# Patient Record
Sex: Male | Born: 1985 | Race: White | Hispanic: No | Marital: Married | Smoking: Never smoker
Health system: Southern US, Community
[De-identification: ages and names within clinical notes are randomized; demographics above are authoritative.]

## PROBLEM LIST (undated history)

## (undated) DIAGNOSIS — K589 Irritable bowel syndrome without diarrhea: Secondary | ICD-10-CM

## (undated) DIAGNOSIS — K219 Gastro-esophageal reflux disease without esophagitis: Secondary | ICD-10-CM

## (undated) DIAGNOSIS — R1013 Epigastric pain: Secondary | ICD-10-CM

## (undated) HISTORY — PX: OTHER SURGICAL HISTORY: SHX169

## (undated) HISTORY — PX: HAND SURGERY: SHX662

---
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
## 2004-05-30 ENCOUNTER — Ambulatory Visit (HOSPITAL_BASED_OUTPATIENT_CLINIC_OR_DEPARTMENT_OTHER): Admission: RE | Admit: 2004-05-30 | Discharge: 2004-05-30 | Payer: Self-pay | Admitting: Otolaryngology

## 2004-08-31 ENCOUNTER — Ambulatory Visit (HOSPITAL_BASED_OUTPATIENT_CLINIC_OR_DEPARTMENT_OTHER): Admission: RE | Admit: 2004-08-31 | Discharge: 2004-08-31 | Payer: Self-pay | Admitting: Orthopedic Surgery

## 2008-10-07 ENCOUNTER — Telehealth (INDEPENDENT_AMBULATORY_CARE_PROVIDER_SITE_OTHER): Payer: Self-pay | Admitting: *Deleted

## 2008-10-07 ENCOUNTER — Ambulatory Visit: Payer: Self-pay | Admitting: Family Medicine

## 2008-10-07 DIAGNOSIS — D409 Neoplasm of uncertain behavior of male genital organ, unspecified: Secondary | ICD-10-CM

## 2008-10-07 DIAGNOSIS — L723 Sebaceous cyst: Secondary | ICD-10-CM

## 2008-10-14 ENCOUNTER — Encounter (INDEPENDENT_AMBULATORY_CARE_PROVIDER_SITE_OTHER): Payer: Self-pay | Admitting: *Deleted

## 2008-10-14 LAB — CONVERTED CEMR LAB
Chlamydia, Swab/Urine, PCR: NEGATIVE
GC Probe Amp, Urine: NEGATIVE

## 2009-10-10 ENCOUNTER — Ambulatory Visit: Payer: Self-pay | Admitting: Family Medicine

## 2009-10-10 DIAGNOSIS — J309 Allergic rhinitis, unspecified: Secondary | ICD-10-CM | POA: Insufficient documentation

## 2009-10-10 DIAGNOSIS — R5381 Other malaise: Secondary | ICD-10-CM

## 2009-10-10 DIAGNOSIS — R5383 Other fatigue: Secondary | ICD-10-CM

## 2009-10-11 ENCOUNTER — Ambulatory Visit: Payer: Self-pay | Admitting: Family Medicine

## 2009-10-11 DIAGNOSIS — E538 Deficiency of other specified B group vitamins: Secondary | ICD-10-CM | POA: Insufficient documentation

## 2009-10-11 LAB — CONVERTED CEMR LAB
ALT: 36 units/L (ref 0–53)
AST: 26 units/L (ref 0–37)
Albumin: 4.7 g/dL (ref 3.5–5.2)
Alkaline Phosphatase: 73 units/L (ref 39–117)
BUN: 13 mg/dL (ref 6–23)
Basophils Absolute: 0 10*3/uL (ref 0.0–0.1)
Basophils Relative: 0.6 % (ref 0.0–3.0)
Bilirubin, Direct: 0.1 mg/dL (ref 0.0–0.3)
CO2: 31 meq/L (ref 19–32)
Calcium: 9.5 mg/dL (ref 8.4–10.5)
Chloride: 102 meq/L (ref 96–112)
Creatinine, Ser: 1 mg/dL (ref 0.4–1.5)
Eosinophils Absolute: 0.4 10*3/uL (ref 0.0–0.7)
Eosinophils Relative: 5.8 % — ABNORMAL HIGH (ref 0.0–5.0)
Folate: 8.3 ng/mL
GFR calc non Af Amer: 99.01 mL/min (ref >60)
Glucose, Bld: 97 mg/dL (ref 70–99)
HCT: 46 % (ref 39.0–52.0)
Hemoglobin: 16 g/dL (ref 13.0–17.0)
Lymphocytes Relative: 25.9 % (ref 12.0–46.0)
Lymphs Abs: 1.7 10*3/uL (ref 0.7–4.0)
MCHC: 34.7 g/dL (ref 30.0–36.0)
MCV: 87.3 fL (ref 78.0–100.0)
Monocytes Absolute: 0.5 10*3/uL (ref 0.1–1.0)
Monocytes Relative: 7.6 % (ref 3.0–12.0)
Neutro Abs: 3.8 10*3/uL (ref 1.4–7.7)
Neutrophils Relative %: 60.1 % (ref 43.0–77.0)
Platelets: 201 10*3/uL (ref 150.0–400.0)
Potassium: 4.4 meq/L (ref 3.5–5.1)
RBC: 5.27 M/uL (ref 4.22–5.81)
RDW: 12.4 % (ref 11.5–14.6)
Sodium: 140 meq/L (ref 135–145)
TSH: 0.66 microintl units/mL (ref 0.35–5.50)
Total Bilirubin: 0.7 mg/dL (ref 0.3–1.2)
Total Protein: 7.6 g/dL (ref 6.0–8.3)
Vitamin B-12: 282 pg/mL (ref 211–911)
WBC: 6.4 10*3/uL (ref 4.5–10.5)

## 2009-10-12 LAB — CONVERTED CEMR LAB
IgE (Immunoglobulin E), Serum: 206.7 intl units/mL — ABNORMAL HIGH (ref 0.0–180.0)
Sex Hormone Binding: 30 nmol/L (ref 13–71)
Testosterone Free: 79.5 pg/mL (ref 47.0–244.0)
Testosterone-% Free: 2.1 % (ref 1.6–2.9)
Testosterone: 373.49 ng/dL (ref 350–890)

## 2009-10-18 ENCOUNTER — Ambulatory Visit (HOSPITAL_BASED_OUTPATIENT_CLINIC_OR_DEPARTMENT_OTHER): Admission: RE | Admit: 2009-10-18 | Discharge: 2009-10-18 | Payer: Self-pay | Admitting: Family Medicine

## 2009-10-18 ENCOUNTER — Ambulatory Visit: Payer: Self-pay | Admitting: Family Medicine

## 2009-10-18 ENCOUNTER — Ambulatory Visit: Payer: Self-pay | Admitting: Diagnostic Radiology

## 2009-10-18 DIAGNOSIS — M546 Pain in thoracic spine: Secondary | ICD-10-CM | POA: Insufficient documentation

## 2009-10-18 DIAGNOSIS — M79609 Pain in unspecified limb: Secondary | ICD-10-CM

## 2009-10-18 DIAGNOSIS — L0591 Pilonidal cyst without abscess: Secondary | ICD-10-CM | POA: Insufficient documentation

## 2009-10-26 ENCOUNTER — Encounter: Payer: Self-pay | Admitting: Family Medicine

## 2010-03-10 ENCOUNTER — Ambulatory Visit: Admit: 2010-03-10 | Payer: Self-pay | Admitting: Internal Medicine

## 2010-03-21 NOTE — Assessment & Plan Note (Signed)
Summary: fatigue, bee sting//fd   Vital Signs:  Patient profile:   25 year old male Weight:      219 pounds BMI:     41.53 Temp:     98.1 degrees F BP sitting:   116 / 76  (left arm)  Vitals Entered By: Almeta Monas CMA Duncan Dull) (October 10, 2009 9:43 AM) CC: c/o feeling Fatigue and requests labwork, pt is fasting   History of Present Illness: Pt here c/o extreme fatigue ---for months.    Pt is under a lot of stress .    Pt exercise tolerance in heat is being affected.  No SOB, no cp,   no joint pain.  Pt has been stung by yellow jackets 2x this summer.  Symptoms seemed  to be exacerbated by these.   No other complaints.  Pt states he is sleeping well.  Current Medications (verified): 1)  B-100 Complex  Tabs (Vitamins-Lipotropics)  Allergies (verified): No Known Drug Allergies  Past History:  Past medical, surgical, family and social histories (including risk factors) reviewed for relevance to current acute and chronic problems.  Family History: Reviewed history and no changes required. Family History of Alcoholism/Addiction Family History of CAD Male 1st degree relative <60 Family History High cholesterol Family History Hypertension Family History Liver disease Family History of Prostate CA 1st degree relative <50 Family History of Thromboembolism clotting disorder  Social History: Reviewed history and no changes required. Occupation: Single Alcohol use-yes Occupation:  employed  Review of Systems      See HPI  Physical Exam  General:  Well-developed,well-nourished,in no acute distress; alert,appropriate and cooperative throughout examination Neck:  No deformities, masses, or tenderness noted. Lungs:  Normal respiratory effort, chest expands symmetrically. Lungs are clear to auscultation, no crackles or wheezes. Heart:  normal rate and no murmur.   Extremities:  No clubbing, cyanosis, edema, or deformity noted with normal full range of motion of all joints.     Skin:  Intact without suspicious lesions or rashes Cervical Nodes:  No lymphadenopathy noted Psych:  Oriented X3, normally interactive, good eye contact, not anxious appearing, and not depressed appearing.     Impression & Recommendations:  Problem # 1:  FATIGUE (ICD-780.79)  Orders: Venipuncture (56213) TLB-B12 + Folate Pnl (08657_84696-E95/MWU) TLB-BMP (Basic Metabolic Panel-BMET) (80048-METABOL) TLB-CBC Platelet - w/Differential (85025-CBCD) TLB-Hepatic/Liver Function Pnl (80076-HEPATIC) TLB-TSH (Thyroid Stimulating Hormone) (13244-WNU) T-Allergy Profile Region [place], [place], [place], [place], [place] [phone]) T- * Misc. Laboratory test [phone]) Specimen Handling (03474)  Complete Medication List: 1)  B-100 Complex Tabs (Vitamins-lipotropics)  Patient Instructions: 1)  con't b complex vitamins

## 2010-03-21 NOTE — Consult Note (Signed)
Summary: [facility]   Imported By: Lanelle Bal 11/21/2009 08:10:28  _____________________________________________________________________  External Attachment:    Type:   Image     Comment:   External Document
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
## 2010-03-21 NOTE — Assessment & Plan Note (Signed)
Summary: B-12//PH  Nurse Visit   Allergies: No Known Drug Allergies  Medication Administration  Injection # 1:    Medication: Vit B12 1000 mcg    Diagnosis: B12 DEFICIENCY (ICD-266.2)    Route: IM    Site: R deltoid    Exp Date: 04/20/2011    Lot #: [id number]    Mfr: [employer]ent  Orders Added: 1)  Vit B12 1000 mcg [J3420] 2)  Admin of Therapeutic Inj  intramuscular or subcutaneous [98119]
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
## 2012-04-28 ENCOUNTER — Telehealth: Payer: Self-pay | Admitting: Family Medicine

## 2012-04-28 NOTE — Telephone Encounter (Signed)
He will need to call call a nurse, we can not call out.     KP

## 2012-04-28 NOTE — Telephone Encounter (Signed)
I gave him call a nurse number

## 2012-04-28 NOTE — Telephone Encounter (Signed)
Benetta Spar Claris Che) patterson called stating her brother has terrible stomach pains and needs to be seen -----[phone]

## 2012-04-28 NOTE — Telephone Encounter (Signed)
Can brittany call?

## 2012-04-29 ENCOUNTER — Ambulatory Visit (INDEPENDENT_AMBULATORY_CARE_PROVIDER_SITE_OTHER): Payer: Self-pay | Admitting: Family Medicine

## 2012-04-29 ENCOUNTER — Encounter: Payer: Self-pay | Admitting: Family Medicine

## 2012-04-29 VITALS — BP 102/80 | HR 71 | Temp 98.5°F | Ht 72.5 in | Wt 219.8 lb

## 2012-04-29 DIAGNOSIS — K589 Irritable bowel syndrome without diarrhea: Secondary | ICD-10-CM

## 2012-04-29 DIAGNOSIS — R109 Unspecified abdominal pain: Secondary | ICD-10-CM

## 2012-04-29 MED ORDER — OMEPRAZOLE MAGNESIUM 20 MG PO TBEC: DELAYED_RELEASE_TABLET | ORAL | Status: DC

## 2012-04-29 MED ORDER — HYOSCYAMINE SULFATE 0.125 MG SL SUBL: 0.1250 mg | SUBLINGUAL_TABLET | SUBLINGUAL | Status: DC | PRN

## 2012-04-29 NOTE — Progress Notes (Signed)
  Subjective:     William Holt is a 27 y.o. male who presents for evaluation of abdominal pain. Onset was several years ago-- he has a long hx of gerd and IBS----he saw Dr Kinnie Scales in past---he thought he might have crohns but colonoscopy never done because pt was 27 yo.  . Symptoms have been gradually worsening. The pain is described as burning and cramping, . Pain is located in the epigastric region and suprapubic region without radiation.  Aggravating factors: alcohol, coffee and spicy foods.  Alleviating factors: H2 blockers and levsin. Associated symptoms: belching and flatus. The patient denies anorexia, arthralagias, chills, constipation, diarrhea, dysuria, fever, frequency, headache, hematochezia, hematuria, melena, myalgias, nausea, sweats and vomiting.  The patient's history has been marked as reviewed and updated as appropriate. allergies, current medications, past family history, past medical history, past social history, past surgical history and problem list  Review of Systems Pertinent items are noted in HPI.     Objective:    BP 102/80  Pulse 71  Temp(Src) 98.5 F (36.9 C) (Oral)  Ht 6' 0.5" (1.842 m)  Wt 219 lb 12.8 oz (99.701 kg)  BMI 29.38 kg/m2  SpO2 98% General appearance: alert, cooperative, appears stated age and no distress Abdomen: abnormal findings:  moderate tenderness in the epigastrium and in the lower abdomen    Assessment:    Abdominal pain, likely secondary to ibs and gerd .    Plan:    The diagnosis was discussed with the patient and evaluation and treatment plans outlined. See orders for lab and imaging studies. Adhere to simple, bland diet. Initiate empiric trial of acid suppression; see orders. Initiate trial of anticholinergic medications for IBS. Further follow-up plans will be based on outcome of lab/imaging studies; see orders. refer to GI

## 2012-04-29 NOTE — Patient Instructions (Signed)
Irritable Bowel Syndrome Irritable Bowel Syndrome (IBS) is caused by a disturbance of normal bowel function. Other terms used are spastic colon, mucous colitis, and irritable colon. It does not require surgery, nor does it lead to cancer. There is no cure for IBS. But with proper diet, stress reduction, and medication, you will find that your problems (symptoms) will gradually disappear or improve. IBS is a common digestive disorder. It usually appears in late adolescence or early adulthood. Women develop it twice as often as men. CAUSES  After food has been digested and absorbed in the small intestine, waste material is moved into the colon (large intestine). In the colon, water and salts are absorbed from the undigested products coming from the small intestine. The remaining residue, or fecal material, is held for elimination. Under normal circumstances, gentle, rhythmic contractions on the bowel walls push the fecal material along the colon towards the rectum. In IBS, however, these contractions are irregular and poorly coordinated. The fecal material is either retained too long, resulting in constipation, or expelled too soon, producing diarrhea. SYMPTOMS  The most common symptom of IBS is pain. It is typically in the lower left side of the belly (abdomen). But it may occur anywhere in the abdomen. It can be felt as heartburn, backache, or even as a dull pain in the arms or shoulders. The pain comes from excessive bowel-muscle spasms and from the buildup of gas and fecal material in the colon. This pain:  Can range from sharp belly (abdominal) cramps to a dull, continuous ache.  Usually worsens soon after eating.  Is typically relieved by having a bowel movement or passing gas. Abdominal pain is usually accompanied by constipation. But it may also produce diarrhea. The diarrhea typically occurs right after a meal or upon arising in the morning. The stools are typically soft and watery. They are often  flecked with secretions (mucus). Other symptoms of IBS include:  Bloating.  Loss of appetite.  Heartburn.  Feeling sick to your stomach (nausea).  Belching  Vomiting  Gas. IBS may also cause a number of symptoms that are unrelated to the digestive system:  Fatigue.  Headaches.  Anxiety  Shortness of breath  Difficulty in concentrating.  Dizziness. These symptoms tend to come and go. DIAGNOSIS  The symptoms of IBS closely mimic the symptoms of other, more serious digestive disorders. So your caregiver may wish to perform a variety of additional tests to exclude these disorders. He/she wants to be certain of learning what is wrong (diagnosis). The nature and purpose of each test will be explained to you. TREATMENT A number of medications are available to help correct bowel function and/or relieve bowel spasms and abdominal pain. Among the drugs available are:  Mild, non-irritating laxatives for severe constipation and to help restore normal bowel habits.  Specific anti-diarrheal medications to treat severe or prolonged diarrhea.  Anti-spasmodic agents to relieve intestinal cramps.  Your caregiver may also decide to treat you with a mild tranquilizer or sedative during unusually stressful periods in your life. The important thing to remember is that if any drug is prescribed for you, make sure that you take it exactly as directed. Make sure that your caregiver knows how well it worked for you. HOME CARE INSTRUCTIONS   Avoid foods that are high in fat or oils. Some examples JJO:ACZYS cream, butter, frankfurters, sausage, and other fatty meats.  Avoid foods that have a laxative effect, such as fruit, fruit juice, and dairy products.  Cut out  carbonated drinks, chewing gum, and "gassy" foods, such as beans and cabbage. This may help relieve bloating and belching.  Bran taken with plenty of liquids may help relieve constipation.  Keep track of what foods seem to trigger  your symptoms.  Avoid emotionally charged situations or circumstances that produce anxiety.  Start or continue exercising.  Get plenty of rest and sleep. MAKE SURE YOU:   Understand these instructions.  Will watch your condition.  Will get help right away if you are not doing well or get worse. Document Released: 02/05/2005 Document Revised: 04/30/2011 Document Reviewed: 09/26/2007 [facility] Patient Information 2013 [place], [place].  Diet for Gastroesophageal Reflux Disease, Adult Reflux (acid reflux) is when acid from your stomach flows up into the esophagus. When acid comes in contact with the esophagus, the acid causes irritation and soreness (inflammation) in the esophagus. When reflux happens often or so severely that it causes damage to the esophagus, it is called gastroesophageal reflux disease (GERD). Nutrition therapy can help ease the discomfort of GERD. FOODS OR DRINKS TO AVOID OR LIMIT  Smoking or chewing tobacco. Nicotine is one of the most potent stimulants to acid production in the gastrointestinal tract.  Caffeinated and decaffeinated coffee and black tea.  Regular or low-calorie carbonated beverages or energy drinks (caffeine-free carbonated beverages are allowed).   Strong spices, such as black pepper, white pepper, red pepper, cayenne, curry powder, and chili powder.  Peppermint or spearmint.  Chocolate.  High-fat foods, including meats and fried foods. Extra added fats including oils, butter, salad dressings, and nuts. Limit these to less than 8 tsp per day.  Fruits and vegetables if they are not tolerated, such as citrus fruits or tomatoes.  Alcohol.  Any food that seems to aggravate your condition. If you have questions regarding your diet, call your caregiver or a registered dietitian. OTHER THINGS THAT MAY HELP GERD INCLUDE:   Eating your meals slowly, in a relaxed setting.  Eating 5 to 6 small meals per day instead of 3 large meals.  Eliminating  food for a period of time if it causes distress.  Not lying down until 3 hours after eating a meal.  Keeping the head of your bed raised 6 to 9 inches (15 to 23 cm) by using a foam wedge or blocks under the legs of the bed. Lying flat may make symptoms worse.  Being physically active. Weight loss may be helpful in reducing reflux in overweight or obese adults.  Wear loose fitting clothing EXAMPLE MEAL PLAN This meal plan is approximately 2,000 calories based on https://www.bernard.org/ meal planning guidelines. Breakfast   cup cooked oatmeal.  1 cup strawberries.  1 cup low-fat milk.  1 oz almonds. Snack  1 cup cucumber slices.  6 oz yogurt (made from low-fat or fat-free milk). Lunch  2 slice whole-wheat bread.  2 oz sliced [place].  2 tsp mayonnaise.  1 cup blueberries.  1 cup snap peas. Snack  6 whole-wheat crackers.  1 oz string cheese. Dinner   cup brown rice.  1 cup mixed veggies.  1 tsp olive oil.  3 oz grilled fish. Document Released: 02/05/2005 Document Revised: 04/30/2011 Document Reviewed: 12/22/2010 [facility] Patient Information 2013 [place], [place].

## 2012-04-30 LAB — BASIC METABOLIC PANEL
BUN: 15 mg/dL (ref 6–23)
CO2: 30 mEq/L (ref 19–32)
Calcium: 9.2 mg/dL (ref 8.4–10.5)
Chloride: 101 mEq/L (ref 96–112)
Creatinine, Ser: 1.1 mg/dL (ref 0.4–1.5)
GFR: 89.65 mL/min (ref >60.00)
Glucose, Bld: 104 mg/dL — ABNORMAL HIGH (ref 70–99)
Potassium: 4.2 mEq/L (ref 3.5–5.1)
Sodium: 138 mEq/L (ref 135–145)

## 2012-04-30 LAB — HEPATIC FUNCTION PANEL
ALT: 21 U/L (ref 0–53)
AST: 22 U/L (ref 0–37)
Albumin: 4.6 g/dL (ref 3.5–5.2)
Alkaline Phosphatase: 65 U/L (ref 39–117)
Bilirubin, Direct: 0 mg/dL (ref 0.0–0.3)
Total Bilirubin: 0.7 mg/dL (ref 0.3–1.2)
Total Protein: 7.7 g/dL (ref 6.0–8.3)

## 2012-04-30 LAB — CBC WITH DIFFERENTIAL/PLATELET
Basophils Absolute: 0.1 10*3/uL (ref 0.0–0.1)
Basophils Relative: 0.8 % (ref 0.0–3.0)
Eosinophils Absolute: 0.3 10*3/uL (ref 0.0–0.7)
Eosinophils Relative: 3.8 % (ref 0.0–5.0)
HCT: 44 % (ref 39.0–52.0)
Hemoglobin: 15 g/dL (ref 13.0–17.0)
Lymphocytes Relative: 25.9 % (ref 12.0–46.0)
Lymphs Abs: 2.1 10*3/uL (ref 0.7–4.0)
MCHC: 34.1 g/dL (ref 30.0–36.0)
MCV: 85.1 fl (ref 78.0–100.0)
Monocytes Absolute: 0.5 10*3/uL (ref 0.1–1.0)
Monocytes Relative: 6.3 % (ref 3.0–12.0)
Neutro Abs: 5.1 10*3/uL (ref 1.4–7.7)
Neutrophils Relative %: 63.2 % (ref 43.0–77.0)
Platelets: 209 10*3/uL (ref 150.0–400.0)
RBC: 5.17 Mil/uL (ref 4.22–5.81)
RDW: 12.3 % (ref 11.5–14.6)
WBC: 8.1 10*3/uL (ref 4.5–10.5)

## 2012-04-30 LAB — H. PYLORI ANTIBODY, IGG: H Pylori IgG: NEGATIVE

## 2012-04-30 MED ORDER — GI COCKTAIL ~~LOC~~
30.0000 mL | Freq: Once | ORAL | Status: AC
  Administered 2012-04-30: 30 mL via ORAL

## 2012-04-30 NOTE — Addendum Note (Signed)
Addended by: Arnette Norris on: 04/30/2012 08:28 AM   Modules accepted: Orders

## 2012-05-07 ENCOUNTER — Encounter: Payer: Self-pay | Admitting: Family Medicine

## 2012-06-02 ENCOUNTER — Telehealth: Payer: Self-pay | Admitting: Family Medicine

## 2012-06-02 ENCOUNTER — Encounter: Payer: Self-pay | Admitting: Family Medicine

## 2012-06-02 NOTE — Telephone Encounter (Signed)
In reference to GI referral entered on 04/29/12, patient has never scheduled.  [facility] attempted to reach patient and left message for a return call, as have I.  I also have mailed patient a letter, patient is not responding.

## 2012-06-02 NOTE — Telephone Encounter (Signed)
noted 

## 2012-06-02 NOTE — Telephone Encounter (Signed)
error 

## 2012-12-25 ENCOUNTER — Other Ambulatory Visit: Payer: Self-pay

## 2013-02-16 ENCOUNTER — Other Ambulatory Visit (INDEPENDENT_AMBULATORY_CARE_PROVIDER_SITE_OTHER): Payer: Self-pay

## 2013-02-16 ENCOUNTER — Encounter: Payer: Self-pay | Admitting: Family Medicine

## 2013-02-16 ENCOUNTER — Ambulatory Visit (INDEPENDENT_AMBULATORY_CARE_PROVIDER_SITE_OTHER): Payer: Self-pay | Admitting: Family Medicine

## 2013-02-16 VITALS — BP 114/80 | HR 79 | Temp 97.6°F | Wt 212.0 lb

## 2013-02-16 DIAGNOSIS — N419 Inflammatory disease of prostate, unspecified: Secondary | ICD-10-CM

## 2013-02-16 DIAGNOSIS — R3915 Urgency of urination: Secondary | ICD-10-CM

## 2013-02-16 LAB — URINALYSIS
Bilirubin Urine: NEGATIVE
Hgb urine dipstick: NEGATIVE
Ketones, ur: NEGATIVE
Leukocytes, UA: NEGATIVE
Nitrite: NEGATIVE
Specific Gravity, Urine: 1.02 (ref 1.000–1.030)
Total Protein, Urine: NEGATIVE
Urine Glucose: NEGATIVE
Urobilinogen, UA: 0.2 (ref 0.0–1.0)
pH: 6 (ref 5.0–8.0)

## 2013-02-16 MED ORDER — CIPROFLOXACIN HCL 500 MG PO TABS: 500.0000 mg | ORAL_TABLET | Freq: Two times a day (BID) | ORAL | Status: DC

## 2013-02-16 NOTE — Patient Instructions (Signed)
Very nice to meet you I will call you with the lab results Start the cipro again and take 2 times daily  If not better keep your appointment with urology.  Happy New Year! Prostatitis The prostate gland is about the size and shape of a walnut. It is located just below your bladder. It produces one of the components of semen, which is made up of sperm and the fluids that help nourish and transport it out from the testicles. Prostatitis is redness, soreness, and swelling (inflammation) of the prostate gland.  There are 3 types of prostatitis:  Acute bacterial prostatitis This is the least common type of prostatitis. It starts quickly and usually leads to a bladder infection. It can occur at any age.  Chronic bacterial prostatitis This is a persistent bacterial infection in the prostate. It usually develops from repeated acute bacterial prostatitis or acute bacterial prostatitis that was not properly treated. It can occur in men of any age but is most common in middle-aged men whose prostate has begun to enlarge.   Chronic prostatitis chronic pelvic pain syndrome This is the most common type of prostatitis. It is inflammation of the prostate gland that is not caused by a bacterial infection. The cause is unknown. CAUSES The cause of acute and chronic bacterial prostatitis is a bacterial infection. The exact cause of chronic prostatitis and chronic pelvic pain syndrome and asymptomatic inflammatory prostatitis is unknown.  SYMPTOMS  Symptoms can vary depending upon the type of prostatitis that exists. There can also be overlap in symptoms. Possible symptoms for each type of prostatitis are listed below. Acute bacterial prostatitis  Painful urination.  Fever or chills.  Muscle or joint pains.  Low back pain.  Low abdominal pain.  Inability to empty bladder completely.  Sudden urge to urinate.  Frequent urination.  Difficulty starting urine stream.  Weak urine stream.  Discharge  from the urethra.  Dribbling after urination.  Rectal pain.  Pain in the testicles, penis, or tip of the penis.  Pain in the space between the anus and scrotum (perineum).  Problems with sexual function.  Painful ejaculation.  Bloody semen. Chronic bacterial prostatitis  The symptoms are similar to those of acute bacterial prostatitis, but they usually are much less severe. Fever, chills, and muscle and joint pain are not associated with chronic bacterial prostatitis. Chronic prostatitis chronic pelvic pain syndrome  Symptoms typically include a dull ache in the scrotum and the perineum. DIAGNOSIS  In order to diagnose prostatitis, your caregiver will ask about your symptoms. If acute or chronic bacterial prostatitis is suspected, a urine sample will be taken and tested (urinalysis). This is to see if there is bacteria in your urine. If the urinalysis result is negative for bacteria, your caregiver may use a finger to feel your prostate (digital rectal exam). This exam helps your caregiver determine if your prostate is swollen and tender. TREATMENT  Treatment for prostatitis depends on the cause. If a bacterial infection is the cause, it can be treated with antibiotic medicine. In cases of chronic bacterial prostatitis, the use of antibiotics for up to 1 month may be necessary. Your caregiver may instruct you to take sitz baths to help relieve pain. A sitz bath is a bath of hot water in which your hips and buttocks are under water. HOME CARE INSTRUCTIONS   Take all medicines as directed by your caregiver.  Take sitz baths as directed by your caregiver. SEEK MEDICAL CARE IF:   Your symptoms get  worse, not better.  You have a fever. SEEK IMMEDIATE MEDICAL CARE IF:   You have chills.  You feel nauseous or vomit.  You feel lightheaded or faint.  You are unable to urinate.  You have blood or blood clots in your urine. Document Released: 02/03/2000 Document Revised: 04/30/2011  Document Reviewed: 08/25/2012 [facility] Patient Information 2014 [place], [place].

## 2013-02-16 NOTE — Progress Notes (Signed)
SUBJECTIVE: William Holt is a 27 y.o. male who complains of urinary frequency, urgency and dysuria x 14 days, without flank pain, fever, chills, or a or bleeding. Patient has never had a problem like this previously. Patient is sexually active for with one partner for greater than 8 years. Patient did have some Cipro and has tried it and was improving. Patient rated of the supra-infra-and the pain started to come back. More of the urgency seems to be the most concerning. No new medications otherwise.  Past medical history, social, surgical and family history all reviewed.    OBJECTIVE: Appears well, in no apparent distress.  Vital signs are normal. The abdomen is soft without tenderness, guarding, mass, rebound or organomegaly. No CVA tenderness or inguinal adenopathy noted. Urine dipstick shows negative for all components.  Micro exam: negative for WBC's or RBC's. GC and Chlamydia pending   ASSESSMENT: Prostatitis  PLAN: Treatment per orders - also push fluids, may use Pyridium OTC prn. Call or return to clinic prn if these symptoms worsen or fail to improve as anticipated. Handout given. Will call with lab results when done. Patient does have a appointment scheduled with urology. Patient does have a history of IBS

## 2013-02-16 NOTE — Progress Notes (Signed)
Pre-visit discussion using our clinic review tool. No additional management support is needed unless otherwise documented below in the visit note.  

## 2013-02-17 LAB — GC/CHLAMYDIA PROBE AMP, URINE
Chlamydia, Swab/Urine, PCR: NEGATIVE
GC Probe Amp, Urine: NEGATIVE

## 2013-03-24 ENCOUNTER — Telehealth: Payer: Self-pay | Admitting: Family Medicine

## 2013-03-24 NOTE — Telephone Encounter (Signed)
Received 4 pages from [facility], sent to Dr. Hulan Saas. 03/24/13/ss

## 2013-04-28 ENCOUNTER — Telehealth: Payer: Self-pay | Admitting: Family Medicine

## 2013-04-28 NOTE — Telephone Encounter (Signed)
Received 5 pages from [facility], sent to [facility] on 04/28/13/ss.

## 2014-01-26 ENCOUNTER — Encounter (HOSPITAL_BASED_OUTPATIENT_CLINIC_OR_DEPARTMENT_OTHER)
Admission: RE | Disposition: A | Discharge status: Home / Self Care | Payer: Self-pay | Source: Ambulatory Visit | Attending: Orthopedic Surgery

## 2014-01-26 ENCOUNTER — Ambulatory Visit (HOSPITAL_BASED_OUTPATIENT_CLINIC_OR_DEPARTMENT_OTHER): Payer: 59 | Admitting: Certified Registered"

## 2014-01-26 ENCOUNTER — Encounter (HOSPITAL_BASED_OUTPATIENT_CLINIC_OR_DEPARTMENT_OTHER): Payer: Self-pay | Admitting: Orthopedic Surgery

## 2014-01-26 ENCOUNTER — Ambulatory Visit (HOSPITAL_BASED_OUTPATIENT_CLINIC_OR_DEPARTMENT_OTHER)
Admission: RE | Admit: 2014-01-26 | Discharge: 2014-01-26 | Disposition: A | Discharge status: Home / Self Care | Payer: 59 | Source: Ambulatory Visit | Attending: Orthopedic Surgery | Admitting: Orthopedic Surgery

## 2014-01-26 DIAGNOSIS — Y929 Unspecified place or not applicable: Secondary | ICD-10-CM | POA: Insufficient documentation

## 2014-01-26 DIAGNOSIS — S61011A Laceration without foreign body of right thumb without damage to nail, initial encounter: Secondary | ICD-10-CM | POA: Diagnosis not present

## 2014-01-26 DIAGNOSIS — Z79899 Other long term (current) drug therapy: Secondary | ICD-10-CM | POA: Diagnosis not present

## 2014-01-26 DIAGNOSIS — W260XXA Contact with knife, initial encounter: Secondary | ICD-10-CM | POA: Diagnosis not present

## 2014-01-26 HISTORY — PX: ARTERY REPAIR: SHX5117

## 2014-01-26 LAB — POCT HEMOGLOBIN-HEMACUE: Hemoglobin: 15.2 g/dL (ref 13.0–17.0)

## 2014-01-26 SURGERY — ARTERY REPAIR
Anesthesia: Regional | Site: Thumb | Laterality: Right

## 2014-01-26 MED ORDER — FENTANYL CITRATE 0.05 MG/ML IJ SOLN
50.0000 ug | INTRAMUSCULAR | Status: DC | PRN
  Administered 2014-01-26: 100 ug via INTRAVENOUS

## 2014-01-26 MED ORDER — FENTANYL CITRATE 0.05 MG/ML IJ SOLN
INTRAMUSCULAR | Status: DC | PRN
  Administered 2014-01-26 (×3): 50 ug via INTRAVENOUS

## 2014-01-26 MED ORDER — DEXAMETHASONE SODIUM PHOSPHATE 10 MG/ML IJ SOLN
INTRAMUSCULAR | Status: DC | PRN
  Administered 2014-01-26: 10 mg via INTRAVENOUS

## 2014-01-26 MED ORDER — HYDROMORPHONE HCL 1 MG/ML IJ SOLN
0.2500 mg | INTRAMUSCULAR | Status: DC | PRN
  Administered 2014-01-26 (×2): 0.5 mg via INTRAVENOUS

## 2014-01-26 MED ORDER — PROPOFOL 10 MG/ML IV BOLUS
INTRAVENOUS | Status: DC | PRN
  Administered 2014-01-26: 300 mg via INTRAVENOUS

## 2014-01-26 MED ORDER — MIDAZOLAM HCL 2 MG/2ML IJ SOLN
INTRAMUSCULAR | Status: AC
  Filled 2014-01-26: qty 2

## 2014-01-26 MED ORDER — LACTATED RINGERS IV SOLN
INTRAVENOUS | Status: DC
  Administered 2014-01-26: 15:00:00 via INTRAVENOUS
  Administered 2014-01-26: 10 mL/h via INTRAVENOUS
  Administered 2014-01-26: 16:00:00 via INTRAVENOUS

## 2014-01-26 MED ORDER — CEPHALEXIN 250 MG PO CAPS: 250.0000 mg | ORAL_CAPSULE | Freq: Four times a day (QID) | ORAL | Status: DC

## 2014-01-26 MED ORDER — HYDROCODONE-ACETAMINOPHEN 5-325 MG PO TABS: 1.0000 | ORAL_TABLET | Freq: Four times a day (QID) | ORAL | Status: DC | PRN

## 2014-01-26 MED ORDER — LIDOCAINE HCL (CARDIAC) 20 MG/ML IV SOLN
INTRAVENOUS | Status: DC | PRN
  Administered 2014-01-26: 60 mg via INTRAVENOUS

## 2014-01-26 MED ORDER — HYDROMORPHONE HCL 1 MG/ML IJ SOLN
INTRAMUSCULAR | Status: AC
  Filled 2014-01-26: qty 1

## 2014-01-26 MED ORDER — ONDANSETRON HCL 4 MG/2ML IJ SOLN: 4.0000 mg | Freq: Once | INTRAMUSCULAR | Status: DC | PRN

## 2014-01-26 MED ORDER — FENTANYL CITRATE 0.05 MG/ML IJ SOLN
INTRAMUSCULAR | Status: AC
  Filled 2014-01-26: qty 2

## 2014-01-26 MED ORDER — MIDAZOLAM HCL 2 MG/2ML IJ SOLN
1.0000 mg | INTRAMUSCULAR | Status: DC | PRN
  Administered 2014-01-26: 2 mg via INTRAVENOUS

## 2014-01-26 MED ORDER — CEFAZOLIN SODIUM-DEXTROSE 2-3 GM-% IV SOLR
2.0000 g | INTRAVENOUS | Status: AC
  Administered 2014-01-26: 2 g via INTRAVENOUS

## 2014-01-26 MED ORDER — OXYCODONE HCL 5 MG/5ML PO SOLN: 5.0000 mg | Freq: Once | ORAL | Status: AC | PRN

## 2014-01-26 MED ORDER — OXYCODONE HCL 5 MG PO TABS
5.0000 mg | ORAL_TABLET | Freq: Once | ORAL | Status: AC | PRN
  Administered 2014-01-26: 5 mg via ORAL

## 2014-01-26 MED ORDER — ONDANSETRON HCL 4 MG/2ML IJ SOLN
INTRAMUSCULAR | Status: DC | PRN
  Administered 2014-01-26: 4 mg via INTRAVENOUS

## 2014-01-26 MED ORDER — OXYCODONE HCL 5 MG PO TABS
ORAL_TABLET | ORAL | Status: AC
  Filled 2014-01-26: qty 1

## 2014-01-26 MED ORDER — CHLORHEXIDINE GLUCONATE 4 % EX LIQD: 60.0000 mL | Freq: Once | CUTANEOUS | Status: DC

## 2014-01-26 MED ORDER — FENTANYL CITRATE 0.05 MG/ML IJ SOLN
INTRAMUSCULAR | Status: AC
  Filled 2014-01-26: qty 4

## 2014-01-26 MED ORDER — BUPIVACAINE-EPINEPHRINE (PF) 0.5% -1:200000 IJ SOLN
INTRAMUSCULAR | Status: DC | PRN
  Administered 2014-01-26: 25 mL via PERINEURAL

## 2014-01-26 SURGICAL SUPPLY — 93 items
BAG DECANTER FOR FLEXI CONT (MISCELLANEOUS) IMPLANT
BALL CTTN LRG ABS STRL LF (GAUZE/BANDAGES/DRESSINGS)
BLADE MINI RND TIP GREEN BEAV (BLADE) IMPLANT
BLADE SURG 15 STRL LF DISP TIS (BLADE) ×2 IMPLANT
BLADE SURG 15 STRL SS (BLADE) ×4
BNDG CMPR 9X4 STRL LF SNTH (GAUZE/BANDAGES/DRESSINGS) ×2
BNDG COHESIVE 3X5 TAN STRL LF (GAUZE/BANDAGES/DRESSINGS) ×1 IMPLANT
BNDG ESMARK 4X9 LF (GAUZE/BANDAGES/DRESSINGS) ×3 IMPLANT
BNDG GAUZE ELAST 4 BULKY (GAUZE/BANDAGES/DRESSINGS) ×1 IMPLANT
CHLORAPREP W/TINT 26ML (MISCELLANEOUS) ×4 IMPLANT
CORDS BIPOLAR (ELECTRODE) ×4 IMPLANT
COTTONBALL LRG STERILE PKG (GAUZE/BANDAGES/DRESSINGS) IMPLANT
COVER BACK TABLE 60X90IN (DRAPES) ×4 IMPLANT
COVER MAYO STAND STRL (DRAPES) ×4 IMPLANT
CUFF TOURNIQUET SINGLE 18IN (TOURNIQUET CUFF) ×3 IMPLANT
DECANTER SPIKE VIAL GLASS SM (MISCELLANEOUS) ×1 IMPLANT
DRAIN TLS ROUND 10FR (DRAIN) IMPLANT
DRAPE EXTREMITY T 121X128X90 (DRAPE) ×4 IMPLANT
DRAPE OEC MINIVIEW 54X84 (DRAPES) IMPLANT
DRAPE SURG 17X23 STRL (DRAPES) ×4 IMPLANT
DRSG KUZMA FLUFF (GAUZE/BANDAGES/DRESSINGS) IMPLANT
GAUZE SPONGE 4X4 12PLY STRL (GAUZE/BANDAGES/DRESSINGS) ×4 IMPLANT
GAUZE SPONGE 4X4 16PLY XRAY LF (GAUZE/BANDAGES/DRESSINGS) IMPLANT
GAUZE XEROFORM 1X8 LF (GAUZE/BANDAGES/DRESSINGS) ×4 IMPLANT
GLOVE BIO SURGEON STRL SZ7.5 (GLOVE) ×3 IMPLANT
GLOVE BIO SURGEON STRL SZ8.5 (GLOVE) ×6 IMPLANT
GLOVE BIOGEL PI IND STRL 7.0 (GLOVE) ×1 IMPLANT
GLOVE BIOGEL PI IND STRL 8 (GLOVE) ×1 IMPLANT
GLOVE BIOGEL PI IND STRL 8.5 (GLOVE) ×2 IMPLANT
GLOVE BIOGEL PI INDICATOR 7.0 (GLOVE) ×2
GLOVE BIOGEL PI INDICATOR 8 (GLOVE) ×2
GLOVE BIOGEL PI INDICATOR 8.5 (GLOVE) ×2
GLOVE ECLIPSE 6.5 STRL STRAW (GLOVE) ×3 IMPLANT
GLOVE SURG ORTHO 8.0 STRL STRW (GLOVE) ×4 IMPLANT
GOWN STRL REUS W/ TWL LRG LVL3 (GOWN DISPOSABLE) ×2 IMPLANT
GOWN STRL REUS W/TWL LRG LVL3 (GOWN DISPOSABLE) ×4
GOWN STRL REUS W/TWL XL LVL3 (GOWN DISPOSABLE) ×7 IMPLANT
K-WIRE .035X4 (WIRE) IMPLANT
LOOP VESSEL MAXI BLUE (MISCELLANEOUS) IMPLANT
NDL KEITH (NEEDLE) IMPLANT
NDL SAFETY ECLIPSE 18X1.5 (NEEDLE) IMPLANT
NEEDLE 27GAX1X1/2 (NEEDLE) IMPLANT
NEEDLE HYPO 18GX1.5 SHARP (NEEDLE)
NEEDLE HYPO 22GX1.5 SAFETY (NEEDLE) IMPLANT
NEEDLE KEITH (NEEDLE) IMPLANT
NS IRRIG 1000ML POUR BTL (IV SOLUTION) ×4 IMPLANT
PACK BASIN DAY SURGERY FS (CUSTOM PROCEDURE TRAY) ×4 IMPLANT
PAD CAST 3X4 CTTN HI CHSV (CAST SUPPLIES) ×1 IMPLANT
PAD CAST 4YDX4 CTTN HI CHSV (CAST SUPPLIES) IMPLANT
PADDING CAST ABS 3INX4YD NS (CAST SUPPLIES)
PADDING CAST ABS 4INX4YD NS (CAST SUPPLIES)
PADDING CAST ABS COTTON 3X4 (CAST SUPPLIES) IMPLANT
PADDING CAST ABS COTTON 4X4 ST (CAST SUPPLIES) ×1 IMPLANT
PADDING CAST COTTON 3X4 STRL (CAST SUPPLIES)
PADDING CAST COTTON 4X4 STRL (CAST SUPPLIES)
SLEEVE SCD COMPRESS KNEE MED (MISCELLANEOUS) IMPLANT
SPEAR EYE SURG WECK-CEL (MISCELLANEOUS) ×4 IMPLANT
SPLINT FINGER 3.25 911903 (SOFTGOODS) ×3 IMPLANT
SPLINT PLASTER CAST XFAST 3X15 (CAST SUPPLIES) IMPLANT
SPLINT PLASTER XTRA FASTSET 3X (CAST SUPPLIES)
STOCKINETTE 4X48 STRL (DRAPES) ×4 IMPLANT
SUT CHROMIC 5 0 P 3 (SUTURE) IMPLANT
SUT ETHIBOND 3-0 V-5 (SUTURE) IMPLANT
SUT ETHILON 10 0 V75 3 (SUTURE) ×3 IMPLANT
SUT ETHILON 5 0 PC 1 (SUTURE) ×4 IMPLANT
SUT FIBERWIRE 2-0 18 17.9 3/8 (SUTURE)
SUT FIBERWIRE 4-0 18 TAPR NDL (SUTURE)
SUT MERSILENE 2.0 SH NDLE (SUTURE) IMPLANT
SUT MERSILENE 3 0 FS 1 (SUTURE) IMPLANT
SUT MERSILENE 4 0 P 3 (SUTURE) IMPLANT
SUT MERSILENE 6 0 P 1 (SUTURE) IMPLANT
SUT NYLON 9 0 VRM6 (SUTURE) IMPLANT
SUT POLY BUTTON 15MM (SUTURE) IMPLANT
SUT PROLENE 2 0 SH DA (SUTURE) IMPLANT
SUT SILK 2 0 FS (SUTURE) IMPLANT
SUT SILK 4 0 PS 2 (SUTURE) IMPLANT
SUT STEEL 3 0 (SUTURE) IMPLANT
SUT STEEL 4 0 V 26 (SUTURE) IMPLANT
SUT SUPRAMID 3-0 (SUTURE) IMPLANT
SUT VIC AB 3-0 PS1 18 (SUTURE)
SUT VIC AB 3-0 PS1 18XBRD (SUTURE) IMPLANT
SUT VIC AB 4-0 P-3 18XBRD (SUTURE) IMPLANT
SUT VIC AB 4-0 P3 18 (SUTURE)
SUT VICRYL 4-0 PS2 18IN ABS (SUTURE) IMPLANT
SUT VICRYL RAPID 5 0 P 3 (SUTURE) IMPLANT
SUT VICRYL RAPIDE 4/0 PS 2 (SUTURE) ×1 IMPLANT
SUTURE FIBERWR 2-0 18 17.9 3/8 (SUTURE) IMPLANT
SUTURE FIBERWR 4-0 18 TAPR NDL (SUTURE) IMPLANT
SYR BULB 3OZ (MISCELLANEOUS) ×4 IMPLANT
SYR CONTROL 10ML LL (SYRINGE) IMPLANT
TOWEL OR 17X24 6PK STRL BLUE (TOWEL DISPOSABLE) ×8 IMPLANT
TUBE FEEDING 5FR 15 INCH (TUBING) IMPLANT
UNDERPAD 30X30 INCONTINENT (UNDERPADS AND DIAPERS) ×4 IMPLANT

## 2014-01-26 NOTE — Discharge Instructions (Addendum)
[facility] Instructions °Hand Surgery ° °Wound Care: °Keep your hand elevated above the level of your heart.  Do not allow it to dangle by your side.  Keep the dressing dry and do not remove it unless your doctor advises you to do so.  He will usually change it at the time of your post-op visit.  Moving your fingers is advised to stimulate circulation but will depend on the site of your surgery.  If you have a splint applied, your doctor will advise you regarding movement. ° °Activity: °Do not drive or operate machinery today.  Rest today and then you may return to your normal activity and work as indicated by your physician. ° °Diet:  °Drink liquids today or eat a light diet.  You may resume a regular diet tomorrow.   ° °General expectations: °Pain for two to three days. °Fingers may become slightly swollen. ° °Call your doctor if any of the following occur: °Severe pain not relieved by pain medication. °Elevated temperature. °Dressing soaked with blood. °Inability to move fingers. °White or bluish color to fingers. ° ° °Regional Anesthesia Blocks ° °1. Numbness or the inability to move the "blocked" extremity may last from 3-48 hours after placement. The length of time depends on the medication injected and your individual response to the medication. If the numbness is not going away after 48 hours, call your surgeon. ° °2. The extremity that is blocked will need to be protected until the numbness is gone and the  Strength has returned. Because you cannot feel it, you will need to take extra care to avoid injury. Because it may be weak, you may have difficulty moving it or using it. You may not know what position it is in without looking at it while the block is in effect. ° °3. For blocks in the legs and feet, returning to weight bearing and walking needs to be done carefully. You will need to wait until the numbness is entirely gone and the strength has returned. You should be able to move your leg and foot  normally before you try and bear weight or walk. You will need someone to be with you when you first try to ensure you do not fall and possibly risk injury. ° °4. Bruising and tenderness at the needle site are common side effects and will resolve in a few days. ° °5. Persistent numbness or new problems with movement should be communicated to the surgeon or the [facility] ([phone])/ [facility] ([phone]). ° ° °Post Anesthesia Home Care Instructions ° °Activity: °Get plenty of rest for the remainder of the day. A responsible adult should stay with you for 24 hours following the procedure.  °For the next 24 hours, DO NOT: °-Drive a car °-Operate machinery °-Drink alcoholic beverages °-Take any medication unless instructed by your physician °-Make any legal decisions or sign important papers. ° °Meals: °Start with liquid foods such as gelatin or soup. Progress to regular foods as tolerated. Avoid greasy, spicy, heavy foods. If nausea and/or vomiting occur, drink only clear liquids until the nausea and/or vomiting subsides. Call your physician if vomiting continues. ° °Special Instructions/Symptoms: °Your throat may feel dry or sore from the anesthesia or the breathing tube placed in your throat during surgery. If this causes discomfort, gargle with warm salt water. The discomfort should disappear within 24 hours. ° °

## 2014-01-26 NOTE — Op Note (Signed)
Dictation Number [id number]

## 2014-01-26 NOTE — Anesthesia Preprocedure Evaluation (Signed)
Anesthesia Evaluation  Patient identified by MRN, date of birth, ID band Patient awake    Reviewed: Allergy & Precautions, H&P , NPO status , Patient's Chart, lab work & pertinent test results  Airway Mallampati: I  TM Distance: >3 FB Neck ROM: Full    Dental  (+) Teeth Intact, Dental Advisory Given   Pulmonary  breath sounds clear to auscultation        Cardiovascular Rhythm:Regular Rate:Normal     Neuro/Psych    GI/Hepatic   Endo/Other    Renal/GU      Musculoskeletal   Abdominal   Peds  Hematology   Anesthesia Other Findings   Reproductive/Obstetrics                             Anesthesia Physical Anesthesia Plan  ASA: I  Anesthesia Plan: General   Post-op Pain Management:    Induction: Intravenous  Airway Management Planned: LMA  Additional Equipment:   Intra-op Plan:   Post-operative Plan: Extubation in OR  Informed Consent: I have reviewed the patients History and Physical, chart, labs and discussed the procedure including the risks, benefits and alternatives for the proposed anesthesia with the patient or authorized representative who has indicated his/her understanding and acceptance.   Dental advisory given  Plan Discussed with: CRNA, Anesthesiologist and Surgeon  Anesthesia Plan Comments:         Anesthesia Quick Evaluation

## 2014-01-26 NOTE — H&P (Signed)
  Mr William Holt is a 28 yr old left hand dominant male who was trying to open a knife with another knife this AM. He suffered a laceration to his right radial thumb and is complaining of stiffness and numbness to the tip of the right thumb  . He has no prior injury.  Past medical history:      Allergies: none     Meds: Levin/SL      Prilosec     Surgery: left index Family History: negative Social History:  Smoke: denies  ETOH: occasional Ros: neg William Holt is an 28 y.o. male.   Chief Complaint: laceration thumb HPI: see above  History reviewed. No pertinent past medical history.  History reviewed. No pertinent past surgical history.  History reviewed. No pertinent family history. Social History:  reports that he has never smoked. He does not have any smokeless tobacco history on file. He reports that he drinks alcohol. He reports that he does not use illicit drugs.  Allergies: No Known Allergies  No prescriptions prior to admission    No results found for this or any previous visit (from the past 48 hour(s)).  No results found.   Pertinent items are noted in HPI.  There were no vitals taken for this visit.  General appearance: alert, cooperative and appears stated age Head: Normocephalic, without obvious abnormality Neck: no JVD Resp: clear to auscultation bilaterally Cardio: regular rate and rhythm, S1, S2 normal, no murmur, click, rub or gallop GI: soft, non-tender; bowel sounds normal; no masses,  no organomegaly Extremities: laceration rt thumb Pulses: 2+ and symmetric Skin: Skin color, texture, turgor normal. No rashes or lesions Neurologic: Grossly normal Incision/Wound: Laceration right thumb  Assessment/Plan Plan exploration, debridement repair of artery and nerve as dictated by findings  Dewell Monnier R 01/26/2014, 11:20 AM

## 2014-01-26 NOTE — Brief Op Note (Signed)
01/26/2014  5:01 PM  PATIENT:  William Holt  28 y.o. male  PRE-OPERATIVE DIAGNOSIS:  right thumb laceration   POST-OPERATIVE DIAGNOSIS:  right thumb laceration  PROCEDURE:  Procedure(s): right thumb exploratio and repair artery (Right)  SURGEON:  Surgeon(s) and Role:    * Daryll Brod, MD - Primary  PHYSICIAN ASSISTANT:   ASSISTANTS: K Arlo Buffone,MD   ANESTHESIA:   regional and general  EBL:  Total I/O In: 1000 [I.V.:1000] Out: -   BLOOD ADMINISTERED:none  DRAINS: none   LOCAL MEDICATIONS USED:  NONE  SPECIMEN:  No Specimen  DISPOSITION OF SPECIMEN:  N/A  COUNTS:  YES  TOURNIQUET:   Total Tourniquet Time Documented: Upper Arm (Right) - 23 minutes Total: Upper Arm (Right) - 23 minutes   DICTATION: .Other Dictation: Dictation Number [id number]  PLAN OF CARE: Discharge to home after PACU  PATIENT DISPOSITION:  PACU - hemodynamically stable.

## 2014-01-26 NOTE — Anesthesia Procedure Notes (Addendum)
Anesthesia Regional Block:  Supraclavicular block  Pre-Anesthetic Checklist: ,, timeout performed, Correct Patient, Correct Site, Correct Laterality, Correct Procedure, Correct Position, site marked, Risks and benefits discussed,  Surgical consent,  Pre-op evaluation,  At surgeon's request and post-op pain management  Laterality: Right and Upper  Prep: chloraprep       Needles:  Injection technique: Single-shot  Needle Type: Echogenic Stimulator Needle     Needle Length: 5cm 5 cm Needle Gauge: 21 and 21 G    Additional Needles:  Procedures: ultrasound guided (picture in chart) Supraclavicular block Narrative:  Start time: 01/26/2014 3:32 PM End time: 01/26/2014 3:36 PM Injection made incrementally with aspirations every 5 mL.  Performed by: Personally  Anesthesiologist: CREWS, DAVID A   Procedure Name: LMA Insertion Date/Time: 01/26/2014 4:22 PM Performed by: Baxter Flattery Pre-anesthesia Checklist: Patient identified, Emergency Drugs available, Suction available and Patient being monitored Patient Re-evaluated:Patient Re-evaluated prior to inductionOxygen Delivery Method: Circle System Utilized Preoxygenation: Pre-oxygenation with 100% oxygen Intubation Type: IV induction Ventilation: Mask ventilation without difficulty LMA: LMA inserted LMA Size: 5.0 Number of attempts: 1 Airway Equipment and Method: bite block Placement Confirmation: positive ETCO2 and breath sounds checked- equal and bilateral Tube secured with: Tape Dental Injury: Teeth and Oropharynx as per pre-operative assessment

## 2014-01-26 NOTE — Anesthesia Postprocedure Evaluation (Signed)
  Anesthesia Post-op Note  Patient: William Holt  Procedure(s) Performed: Procedure(s): right thumb exploratio and repair artery (Right)  Patient Location: PACU  Anesthesia Type:GA combined with regional for post-op pain  Level of Consciousness: awake, alert , oriented and patient cooperative  Airway and Oxygen Therapy: Patient Spontanous Breathing  Post-op Pain: mild  Post-op Assessment: Post-op Vital signs reviewed, Patient's Cardiovascular Status Stable, Respiratory Function Stable, Patent Airway, No signs of Nausea or vomiting, Adequate PO intake and Pain level controlled  Post-op Vital Signs: Reviewed and stable  Last Vitals:  Filed Vitals:   01/26/14 1808  BP:   Pulse: 79  Temp:   Resp: 17    Complications: No apparent anesthesia complications

## 2014-01-26 NOTE — Transfer of Care (Signed)
Immediate Anesthesia Transfer of Care Note  Patient: William Holt  Procedure(s) Performed: Procedure(s): right thumb exploratio and repair artery (Right)  Patient Location: PACU  Anesthesia Type:GA combined with regional for post-op pain  Level of Consciousness: awake, alert  and oriented  Airway & Oxygen Therapy: Patient Spontanous Breathing and Patient connected to face mask oxygen  Post-op Assessment: Report given to PACU RN, Post -op Vital signs reviewed and stable and Patient moving all extremities  Post vital signs: Reviewed and stable  Complications: No apparent anesthesia complications

## 2014-01-26 NOTE — Progress Notes (Signed)
Assisted Dr. Crews with right, ultrasound guided, supraclavicular block. Side rails up, monitors on throughout procedure. See vital signs in flow sheet. Tolerated Procedure well. 

## 2014-01-27 ENCOUNTER — Encounter (HOSPITAL_BASED_OUTPATIENT_CLINIC_OR_DEPARTMENT_OTHER): Payer: Self-pay | Admitting: Orthopedic Surgery

## 2014-01-27 NOTE — Op Note (Signed)
NAMEISOM, KOCHAN                 ACCOUNT NO.:  [id number]  MEDICAL RECORD NO.:  [id number]  LOCATION:                                 FACILITY:  PHYSICIAN:  Daryll Brod, M.D.            DATE OF BIRTH:  DATE OF PROCEDURE:  01/26/2014 DATE OF DISCHARGE:                              OPERATIVE REPORT   PREOPERATIVE DIAGNOSIS:  Laceration, right thumb.  POSTOPERATIVE DIAGNOSIS:  Laceration, right thumb.  OPERATION:  Debridement, exploration and repair of digital artery, right thumb.  SURGEON:  Daryll Brod, M.D.  ASSISTANT:  Leanora Cover, MD  ANESTHESIA:  Supraclavicular block, general.  ANESTHESIOLOGIST:  Lorrene Reid, M.D.  HISTORY:  The patient is a 28 year old male who __________.  The knife slipped, he suffered a laceration to the proximal phalanx of his right thumb.  He complains of numbness distally and a feeling of stiffness. Plan is for exploration, repair of arteries, tendons and nerves, dictated by findings.  He is aware that there is no guarantee with surgery; possibility of infection; recurrence of injury to arteries, nerves, tendons; incomplete relief of symptoms and dystrophy.  In the preoperative area, the patient is seen, the extremity marked by both patient and surgeon, and antibiotic given.  DESCRIPTION OF PROCEDURE:  The patient was brought to the operating room where a general anesthetic was carried out without difficulty.  A supraclavicular block had been given in the preoperative area.  The patient was prepped and draped in a supine position with the right arm free.  Prepping was done with Betadine scrub and solution.  Time-out was taken confirming the patient and procedure.  A tourniquet placed high on the arm was inflated to 250 mmHg.  The wound was opened, extended.  The digital nerve was found to be intact.  The digital artery was found to be lacerated.  The operative microscope was brought into position. Flexor tendon was intact.  The artery was then  freshened.  The lumen irrigated, dilated, and repair performed with interrupted 10-0 nylon sutures.  The wound was copiously irrigated with saline.  Blood immediately flowed across the site.  The skin was then closed with interrupted 5-0 nylon sutures.  Sterile compressive dressing and thumb spica splint applied.  On deflation of the tourniquet, all fingers were immediately pinked.  He was taken to the recovery room for observation in satisfactory condition.  He will be discharged to home to return to the [facility] in 1 week, on Vicodin and Keflex.          ______________________________ Daryll Brod, M.D.     GK/MEDQ  D:  01/26/2014  T:  01/27/2014  Job:  [id number]

## 2014-09-09 ENCOUNTER — Emergency Department (HOSPITAL_COMMUNITY): Payer: 59

## 2014-09-09 ENCOUNTER — Encounter (HOSPITAL_COMMUNITY): Payer: Self-pay | Admitting: Emergency Medicine

## 2014-09-09 ENCOUNTER — Emergency Department (HOSPITAL_COMMUNITY)
Admission: EM | Admit: 2014-09-09 | Discharge: 2014-09-09 | Disposition: A | Discharge status: Home / Self Care | Payer: 59 | Attending: Emergency Medicine | Admitting: Emergency Medicine

## 2014-09-09 DIAGNOSIS — K921 Melena: Secondary | ICD-10-CM | POA: Diagnosis present

## 2014-09-09 DIAGNOSIS — R1032 Left lower quadrant pain: Secondary | ICD-10-CM | POA: Insufficient documentation

## 2014-09-09 DIAGNOSIS — R197 Diarrhea, unspecified: Secondary | ICD-10-CM | POA: Diagnosis not present

## 2014-09-09 DIAGNOSIS — R1030 Lower abdominal pain, unspecified: Secondary | ICD-10-CM | POA: Diagnosis not present

## 2014-09-09 DIAGNOSIS — R1031 Right lower quadrant pain: Secondary | ICD-10-CM | POA: Insufficient documentation

## 2014-09-09 DIAGNOSIS — Z7982 Long term (current) use of aspirin: Secondary | ICD-10-CM | POA: Diagnosis not present

## 2014-09-09 DIAGNOSIS — R110 Nausea: Secondary | ICD-10-CM | POA: Insufficient documentation

## 2014-09-09 DIAGNOSIS — K589 Irritable bowel syndrome without diarrhea: Secondary | ICD-10-CM

## 2014-09-09 HISTORY — DX: Irritable bowel syndrome, unspecified: K58.9

## 2014-09-09 LAB — LIPASE, BLOOD: Lipase: 16 U/L — ABNORMAL LOW (ref 22–51)

## 2014-09-09 LAB — CBC
HEMATOCRIT: 46.8 % (ref 39.0–52.0)
Hemoglobin: 16.7 g/dL (ref 13.0–17.0)
MCH: 29.8 pg (ref 26.0–34.0)
MCHC: 35.7 g/dL (ref 30.0–36.0)
MCV: 83.6 fL (ref 78.0–100.0)
Platelets: 192 10*3/uL (ref 150–400)
RBC: 5.6 MIL/uL (ref 4.22–5.81)
RDW: 11.9 % (ref 11.5–15.5)
WBC: 9.2 10*3/uL (ref 4.0–10.5)

## 2014-09-09 LAB — COMPREHENSIVE METABOLIC PANEL
ALT: 22 U/L (ref 17–63)
AST: 23 U/L (ref 15–41)
Albumin: 5.1 g/dL — ABNORMAL HIGH (ref 3.5–5.0)
Alkaline Phosphatase: 72 U/L (ref 38–126)
Anion gap: 7 (ref 5–15)
BUN: 15 mg/dL (ref 6–20)
CHLORIDE: 105 mmol/L (ref 101–111)
CO2: 26 mmol/L (ref 22–32)
Calcium: 9.3 mg/dL (ref 8.9–10.3)
Creatinine, Ser: 0.91 mg/dL (ref 0.61–1.24)
GFR calc non Af Amer: >60 mL/min (ref >60)
Glucose, Bld: 111 mg/dL — ABNORMAL HIGH (ref 65–99)
POTASSIUM: 4.1 mmol/L (ref 3.5–5.1)
Sodium: 138 mmol/L (ref 135–145)
Total Bilirubin: 0.8 mg/dL (ref 0.3–1.2)
Total Protein: 8.5 g/dL — ABNORMAL HIGH (ref 6.5–8.1)

## 2014-09-09 LAB — URINALYSIS, ROUTINE W REFLEX MICROSCOPIC
BILIRUBIN URINE: NEGATIVE
Glucose, UA: NEGATIVE mg/dL
HGB URINE DIPSTICK: NEGATIVE
Ketones, ur: NEGATIVE mg/dL
Leukocytes, UA: NEGATIVE
Nitrite: NEGATIVE
PROTEIN: NEGATIVE mg/dL
Specific Gravity, Urine: 1.021 (ref 1.005–1.030)
UROBILINOGEN UA: 0.2 mg/dL (ref 0.0–1.0)
pH: 5.5 (ref 5.0–8.0)

## 2014-09-09 MED ORDER — SODIUM CHLORIDE 0.9 % IV SOLN
1000.0000 mL | Freq: Once | INTRAVENOUS | Status: AC
  Administered 2014-09-09: 1000 mL via INTRAVENOUS

## 2014-09-09 MED ORDER — IOHEXOL 300 MG/ML  SOLN
100.0000 mL | Freq: Once | INTRAMUSCULAR | Status: AC | PRN
  Administered 2014-09-09: 100 mL via INTRAVENOUS

## 2014-09-09 MED ORDER — HYDROMORPHONE HCL 1 MG/ML IJ SOLN
1.0000 mg | INTRAMUSCULAR | Status: DC | PRN
  Administered 2014-09-09: 1 mg via INTRAVENOUS
  Filled 2014-09-09: qty 1

## 2014-09-09 MED ORDER — HYOSCYAMINE SULFATE 0.125 MG SL SUBL: 0.1250 mg | SUBLINGUAL_TABLET | SUBLINGUAL | Status: DC | PRN

## 2014-09-09 MED ORDER — SODIUM CHLORIDE 0.9 % IV SOLN
1000.0000 mL | INTRAVENOUS | Status: DC
  Administered 2014-09-09: 1000 mL via INTRAVENOUS

## 2014-09-09 MED ORDER — IOHEXOL 300 MG/ML  SOLN
50.0000 mL | Freq: Once | INTRAMUSCULAR | Status: AC | PRN
  Administered 2014-09-09: 50 mL via ORAL

## 2014-09-09 MED ORDER — ONDANSETRON 8 MG PO TBDP: 8.0000 mg | ORAL_TABLET | Freq: Three times a day (TID) | ORAL | Status: DC | PRN

## 2014-09-09 MED ORDER — ONDANSETRON HCL 4 MG/2ML IJ SOLN
4.0000 mg | Freq: Once | INTRAMUSCULAR | Status: AC
  Administered 2014-09-09: 4 mg via INTRAVENOUS
  Filled 2014-09-09: qty 2

## 2014-09-09 NOTE — ED Notes (Signed)
Pt c/o lower abd pain that started last night with heavy dry heaving and then bright red red x 2 in water and on toilet paper.  Pt states that stool was loose and watery. Pt denies vomiting.

## 2014-09-09 NOTE — Discharge Instructions (Signed)

## 2014-09-09 NOTE — ED Provider Notes (Signed)
CSN: [id number]     Arrival date & time 09/09/14  6269 History   First MD Initiated Contact with Patient 09/09/14 1033     Chief Complaint  Patient presents with  . Abdominal Pain  . Nausea  . Blood In Stools   Patient is a 29 y.o. male presenting with abdominal pain. The history is provided by the patient.  Abdominal Pain Pain location:  LLQ and RLQ Pain quality: cramping and sharp   Pain radiation: slightly to the lower back. Onset quality:  Sudden Duration:  10 hours Relieved by:  Nothing Ineffective treatments:  None tried Associated symptoms: diarrhea (went to the bathroom 7 times), hematochezia (loose stool mixed with blood , no blood initially) and nausea   Associated symptoms: no fever and no vomiting   No abx for years.  No recent trips or travel.    Past Medical History  Diagnosis Date  . IBS (irritable bowel syndrome)    Past Surgical History  Procedure Laterality Date  . 2 other hand surgeries,2006, 2002    . Artery repair Right 01/26/2014    Procedure: right thumb exploratio and repair artery;  Surgeon: Daryll Brod, MD;  Location: [place];  Service: Orthopedics;  Laterality: Right;   No family history on file. History  Substance Use Topics  . Smoking status: Never Smoker   . Smokeless tobacco: Not on file  . Alcohol Use: Yes     Comment: social    Review of Systems  Constitutional: Negative for fever.  Gastrointestinal: Positive for nausea, abdominal pain, diarrhea (went to the bathroom 7 times) and hematochezia (loose stool mixed with blood , no blood initially). Negative for vomiting.  All other systems reviewed and are negative.     Allergies  Review of patient's allergies indicates no known allergies.  Home Medications   Prior to Admission medications   Medication Sig Start Date End Date Taking? Authorizing Provider  aspirin 81 MG tablet Take 81 mg by mouth daily.   Yes Historical Provider, MD  omeprazole (PRILOSEC OTC) 20 MG  tablet 1 po qd 04/29/12  Yes Yvonne R Lowne, DO  ranitidine (ZANTAC) 150 MG tablet Take 150 mg by mouth 2 (two) times daily as needed for heartburn.   Yes Historical Provider, MD  cephALEXin (KEFLEX) 250 MG capsule Take 1 capsule (250 mg total) by mouth 4 (four) times daily. Patient not taking: Reported on 09/09/2014 01/26/14   Daryll Brod, MD  ciprofloxacin (CIPRO) 500 MG tablet Take 1 tablet (500 mg total) by mouth 2 (two) times daily. Patient not taking: Reported on 09/09/2014 02/16/13   Lyndal Pulley, DO  HYDROcodone-acetaminophen [facility]) 5-325 MG per tablet Take 1 tablet by mouth every 6 (six) hours as needed for moderate pain. Patient not taking: Reported on 09/09/2014 01/26/14   Daryll Brod, MD  hyoscyamine (LEVSIN/SL) 0.125 MG SL tablet Place 1 tablet (0.125 mg total) under the tongue every 4 (four) hours as needed for cramping. 09/09/14   Dorie Rank, MD  ondansetron (ZOFRAN ODT) 8 MG disintegrating tablet Take 1 tablet (8 mg total) by mouth every 8 (eight) hours as needed for nausea or vomiting. 09/09/14   Dorie Rank, MD   BP 127/80 mmHg  Pulse 61  Temp(Src) 97.7 F (36.5 C) (Oral)  Resp 18  Ht 6\' 1"  (1.854 m)  Wt 219 lb (99.338 kg)  BMI 28.90 kg/m2  SpO2 100% Physical Exam  Constitutional: He appears well-developed and well-nourished. No distress.  HENT:  Head:  Normocephalic and atraumatic.  Right Ear: External ear normal.  Left Ear: External ear normal.  Eyes: Conjunctivae are normal. Right eye exhibits no discharge. Left eye exhibits no discharge. No scleral icterus.  Neck: Neck supple. No tracheal deviation present.  Cardiovascular: Normal rate, regular rhythm and intact distal pulses.   Pulmonary/Chest: Effort normal and breath sounds normal. No stridor. No respiratory distress. He has no wheezes. He has no rales.  Abdominal: Soft. Bowel sounds are normal. He exhibits no distension. There is tenderness in the right lower quadrant and left lower quadrant. There is no rebound and no  guarding.  Musculoskeletal: He exhibits no edema or tenderness.  Neurological: He is alert. He has normal strength. No cranial nerve deficit (no facial droop, extraocular movements intact, no slurred speech) or sensory deficit. He exhibits normal muscle tone. He displays no seizure activity. Coordination normal.  Skin: Skin is warm and dry. No rash noted.  Psychiatric: He has a normal mood and affect.  Nursing note and vitals reviewed.   ED Course  Procedures (including critical care time) Labs Review Labs Reviewed  LIPASE, BLOOD - Abnormal; Notable for the following:    Lipase 16 (*)    All other components within normal limits  COMPREHENSIVE METABOLIC PANEL - Abnormal; Notable for the following:    Glucose, Bld 111 (*)    Total Protein 8.5 (*)    Albumin 5.1 (*)    All other components within normal limits  CBC  URINALYSIS, ROUTINE W REFLEX MICROSCOPIC (NOT AT [facility])    Imaging Review Ct Abdomen Pelvis W Contrast  09/09/2014   CLINICAL DATA:  Acute lower abdominal pain.  EXAM: CT ABDOMEN AND PELVIS WITH CONTRAST  TECHNIQUE: Multidetector CT imaging of the abdomen and pelvis was performed using the standard protocol following bolus administration of intravenous contrast.  CONTRAST:  168mL OMNIPAQUE IOHEXOL 300 MG/ML  SOLN  COMPARISON:  CT scan of March 18, 2013.  FINDINGS: Visualized lung bases appear normal. No significant osseous abnormality is noted.  No gallstones are noted. The liver, spleen and pancreas appear normal. Adrenal glands and kidneys appear normal. No hydronephrosis or renal obstruction is noted. No renal or ureteral calculi are noted. The appendix appears normal. There is no evidence of bowel obstruction. No abnormal fluid collection is noted. Urinary bladder appears normal. No significant adenopathy is noted.  IMPRESSION: No significant abnormality seen in the abdomen or pelvis.   Electronically Signed   By: Marijo Conception, M.D.   On: 09/09/2014 13:10    Medications   0.9 %  sodium chloride infusion (0 mLs Intravenous Stopped 09/09/14 1227)    Followed by  0.9 %  sodium chloride infusion (1,000 mLs Intravenous New Bag/Given 09/09/14 1229)  HYDROmorphone (DILAUDID) injection 1 mg (1 mg Intravenous Given 09/09/14 1118)  ondansetron (ZOFRAN) injection 4 mg (4 mg Intravenous Given 09/09/14 1117)  iohexol (OMNIPAQUE) 300 MG/ML solution 50 mL (50 mLs Oral Contrast Given 09/09/14 1205)  iohexol (OMNIPAQUE) 300 MG/ML solution 100 mL (100 mLs Intravenous Contrast Given 09/09/14 1241)     MDM   Final diagnoses:  Lower abdominal pain   CT scan without acute findings.  Labs are reassuring.  Pt has history of IBS in the past.  It is possible that he is experiencing recurrent symptoms.  Discussed outpatient follow up with PCP or GI.  At this time there does not appear to be any evidence of an acute emergency medical condition and the patient appears stable for discharge with  appropriate outpatient follow up.      Dorie Rank, MD 09/09/14 [phone]

## 2014-12-18 ENCOUNTER — Emergency Department (HOSPITAL_COMMUNITY)
Admission: EM | Admit: 2014-12-18 | Discharge: 2014-12-18 | Disposition: A | Discharge status: Home / Self Care | Payer: 59 | Attending: Emergency Medicine | Admitting: Emergency Medicine

## 2014-12-18 ENCOUNTER — Encounter (HOSPITAL_COMMUNITY): Payer: Self-pay | Admitting: Emergency Medicine

## 2014-12-18 DIAGNOSIS — Z7982 Long term (current) use of aspirin: Secondary | ICD-10-CM | POA: Insufficient documentation

## 2014-12-18 DIAGNOSIS — S61012A Laceration without foreign body of left thumb without damage to nail, initial encounter: Secondary | ICD-10-CM | POA: Diagnosis present

## 2014-12-18 DIAGNOSIS — Y9389 Activity, other specified: Secondary | ICD-10-CM | POA: Diagnosis not present

## 2014-12-18 DIAGNOSIS — W312XXA Contact with powered woodworking and forming machines, initial encounter: Secondary | ICD-10-CM | POA: Insufficient documentation

## 2014-12-18 DIAGNOSIS — K589 Irritable bowel syndrome without diarrhea: Secondary | ICD-10-CM | POA: Diagnosis not present

## 2014-12-18 DIAGNOSIS — Y998 Other external cause status: Secondary | ICD-10-CM | POA: Diagnosis not present

## 2014-12-18 DIAGNOSIS — Z79899 Other long term (current) drug therapy: Secondary | ICD-10-CM | POA: Insufficient documentation

## 2014-12-18 DIAGNOSIS — IMO0002 Reserved for concepts with insufficient information to code with codable children: Secondary | ICD-10-CM

## 2014-12-18 DIAGNOSIS — Y9289 Other specified places as the place of occurrence of the external cause: Secondary | ICD-10-CM | POA: Insufficient documentation

## 2014-12-18 MED ORDER — LIDOCAINE HCL (PF) 1 % IJ SOLN
5.0000 mL | Freq: Once | INTRAMUSCULAR | Status: AC
  Administered 2014-12-18: 5 mL
  Filled 2014-12-18: qty 5

## 2014-12-18 MED ORDER — ONDANSETRON 8 MG PO TBDP
8.0000 mg | ORAL_TABLET | Freq: Once | ORAL | Status: AC
  Administered 2014-12-18: 8 mg via ORAL
  Filled 2014-12-18: qty 1

## 2014-12-18 NOTE — ED Notes (Signed)
Patient cut left thumb with band saw.  Bleeding under control.  Tetanus up to date.  Patient can move thumb.

## 2014-12-18 NOTE — Discharge Instructions (Signed)
You may use ibuprofen as prescribed over-the-counter for pain relief. Keep ear wounds clean (using soap and water) and dry. Return to the emergency department in 7 days for suture removal. Return to the emergency department sooner if symptoms worsen or new onset of fever, swelling and drainage, redness or any other signs of infection.

## 2014-12-18 NOTE — ED Provider Notes (Signed)
CSN: [id number]   Arrival date & time 12/18/14 1417  History  By signing my name below, I, Altamease Oiler, attest that this documentation has been prepared under the direction and in the presence of Harlene Ramus, [place]. Electronically Signed: Altamease Oiler, ED Scribe. 12/18/2014. 4:47 PM. Chief Complaint  Patient presents with  . Extremity Laceration    HPI The history is provided by the patient. No language interpreter was used.   William Holt is a 29 y.o. male who presents to the Emergency Department complaining of a laceration at the left thumb with onset 1 hour ago while using a band saw, bleeding controlled.  Associated symptoms include pain in the left thumb and hand. The pain is rated 5-6/10 in severity and described as sharp, pulsating, and throbbing. He took nothing for pain PTA.   Pt denies numbness and tingling and weakness. No anticoagulation. Last tetanus shot was 6 months ago. He is otherwise healthy and takes no daily medication. NKDA.   Past Medical History  Diagnosis Date  . IBS (irritable bowel syndrome)     Past Surgical History  Procedure Laterality Date  . 2 other hand surgeries,2006, 2002    . Artery repair Right 01/26/2014    Procedure: right thumb exploratio and repair artery;  Surgeon: Daryll Brod, MD;  Location: [place];  Service: Orthopedics;  Laterality: Right;    No family history on file.  Social History  Substance Use Topics  . Smoking status: Never Smoker   . Smokeless tobacco: None  . Alcohol Use: Yes     Comment: social     Review of Systems  Musculoskeletal:       Painful laceration at left thumb Left hand pain  Neurological: Negative for numbness.    Home Medications   Prior to Admission medications   Medication Sig Start Date End Date Taking? Authorizing Provider  aspirin 81 MG tablet Take 81 mg by mouth daily.    Historical Provider, MD  hyoscyamine (LEVSIN/SL) 0.125 MG SL tablet Place 1 tablet (0.125 mg total)  under the tongue every 4 (four) hours as needed for cramping. 09/09/14   Dorie Rank, MD  omeprazole (PRILOSEC OTC) 20 MG tablet 1 po qd Patient taking differently: Take 20 mg by mouth daily.  04/29/12   Rosalita Chessman, DO  ondansetron (ZOFRAN ODT) 8 MG disintegrating tablet Take 1 tablet (8 mg total) by mouth every 8 (eight) hours as needed for nausea or vomiting. 09/09/14   Dorie Rank, MD  ranitidine (ZANTAC) 150 MG tablet Take 150 mg by mouth 2 (two) times daily as needed for heartburn.    Historical Provider, MD    Allergies  Review of patient's allergies indicates no known allergies.  Triage Vitals: BP 81/43 mmHg  Pulse 63  Temp(Src) 97.6 F (36.4 C) (Oral)  Resp 24  SpO2 97%  Physical Exam  Constitutional: He is oriented to person, place, and time. He appears well-developed and well-nourished.  HENT:  Head: Normocephalic.  Eyes: Conjunctivae and EOM are normal. Right eye exhibits no discharge. Left eye exhibits no discharge. No scleral icterus.  Neck: Normal range of motion. Neck supple.  Cardiovascular: Normal rate.   Pulmonary/Chest: Effort normal. No respiratory distress.  Abdominal: He exhibits no distension.  Musculoskeletal: Normal range of motion.       Left hand: He exhibits laceration. Normal sensation noted. Normal strength noted.       Hands: 1.5 cm laceration to the palmar aspect of the left  distal thumb, no active bleeding. Capillary refill less than 2 seconds. Sensation intact to light touch. FROM of left thumb and hand. 5/5 strength. 2+ radial pulses. No TTP.   Neurological: He is alert and oriented to person, place, and time.  Psychiatric: He has a normal mood and affect.  Nursing note and vitals reviewed.   ED Course  Procedures LACERATION REPAIR Performed by: Harlene Ramus, PA-C Consent: Verbal consent obtained. Risks and benefits: risks, benefits and alternatives were discussed Patient identity confirmed: provided demographic data Time out performed prior  to procedure Prepped and Draped in normal sterile fashion Wound explored Laceration Location: left distal thumb Laceration Length: 1.5 cm No Foreign Bodies seen or palpated Anesthesia: local infiltration Local anesthetic: lidocaine 1% without epinephrine Anesthetic total: 4 ml Irrigation method: syringe Amount of cleaning: standard Skin closure: 4-0 Prolene Number of sutures: 5 Technique: simple interrupted Patient tolerance: Patient tolerated the procedure well with no immediate complications.  DIAGNOSTIC STUDIES: Oxygen Saturation is 97% on RA,  normal by my interpretation.    COORDINATION OF CARE: 3:12 PM Discussed treatment plan which includes laceration repair with pt at bedside and pt agreed to the plan.  Labs Review- Labs Reviewed - No data to display  Imaging Review No results found.   Filed Vitals:   12/18/14 1704  BP: 119/59  Pulse: 49  Temp:   Resp: 20     MDM   Final diagnoses:  Laceration   Patient presents with laceration to left thumb, bleeding controlled. Tetanus up-to-date. VSS. Exam revealed 1.5 cm laceration to palmar aspect of the left distal thumb, no active bleeding, full range of motion of left thumb and hand, hand neurovascularly intact. Wound irrigated and revealed laceration depth into the subcutaneous fat, no involvement of tendons, muscles, bone. Laceration repair completed without any complications. Plan to discharge patient home. Advised patient to keep wound clean and dry and to return to the ED in 7 days for suture removal.  Evaluation does not show pathology requring ongoing emergent intervention or admission. Pt is hemodynamically stable and mentating appropriately. Discussed findings/results and plan with patient/guardian, who agrees with plan. All questions answered. Return precautions discussed and outpatient follow up given.    I personally performed the services described in this documentation, which was scribed in my presence. The  recorded information has been reviewed and is accurate.      Chesley Noon [place], PA-C 12/19/14 [place], DO 12/22/14 [phone]

## 2015-02-21 DIAGNOSIS — J0140 Acute pansinusitis, unspecified: Secondary | ICD-10-CM | POA: Diagnosis not present

## 2015-02-21 DIAGNOSIS — J209 Acute bronchitis, unspecified: Secondary | ICD-10-CM | POA: Diagnosis not present

## 2015-08-01 ENCOUNTER — Encounter: Payer: Self-pay | Admitting: Family Medicine

## 2015-08-01 ENCOUNTER — Ambulatory Visit (INDEPENDENT_AMBULATORY_CARE_PROVIDER_SITE_OTHER): Payer: 59 | Admitting: Family Medicine

## 2015-08-01 VITALS — BP 122/74 | HR 71 | Ht 73.0 in | Wt 217.0 lb

## 2015-08-01 DIAGNOSIS — M999 Biomechanical lesion, unspecified: Secondary | ICD-10-CM

## 2015-08-01 DIAGNOSIS — M9901 Segmental and somatic dysfunction of cervical region: Secondary | ICD-10-CM | POA: Diagnosis not present

## 2015-08-01 DIAGNOSIS — M9903 Segmental and somatic dysfunction of lumbar region: Secondary | ICD-10-CM | POA: Diagnosis not present

## 2015-08-01 DIAGNOSIS — M9902 Segmental and somatic dysfunction of thoracic region: Secondary | ICD-10-CM

## 2015-08-01 DIAGNOSIS — M899 Disorder of bone, unspecified: Secondary | ICD-10-CM | POA: Insufficient documentation

## 2015-08-01 DIAGNOSIS — M546 Pain in thoracic spine: Secondary | ICD-10-CM

## 2015-08-01 MED ORDER — MELOXICAM 15 MG PO TABS: 15.0000 mg | ORAL_TABLET | Freq: Every day | ORAL | Status: DC

## 2015-08-01 MED ORDER — TIZANIDINE HCL 4 MG PO TABS: 4.0000 mg | ORAL_TABLET | Freq: Every evening | ORAL | Status: DC

## 2015-08-01 MED FILL — MELOXICAM 15 MG TABLET: 15 | 30 days supply | Qty: 30 | Fill #0

## 2015-08-01 MED FILL — tiZANidine HCL 4 MG TABS: 4 | 30 days supply | Qty: 30 | Fill #0

## 2015-08-01 NOTE — Progress Notes (Signed)
Pre visit review using our clinic review tool, if applicable. No additional management support is needed unless otherwise documented below in the visit note. 

## 2015-08-01 NOTE — Assessment & Plan Note (Signed)
Decision today to treat with OMT was based on Physical Exam  After verbal consent patient was treated with HVLA, ME techniques in cervical, thoracic and lumbar areas  Patient tolerated the procedure well with improvement in symptoms  Patient given exercises, stretches and lifestyle modifications  See medications in patient instructions if given  Patient will follow up in 3-4 weeks    

## 2015-08-01 NOTE — Assessment & Plan Note (Signed)
Believe the patient's pain is multifactorial. I do believe the patient has poor posture as well as scapular dyskinesia. Patient given exercises and worked out with atc.  Patient given medication, oral anti-inflammatories as well as a muscle relaxant that I think will be beneficial. We discussed ergonomics are up-to-date be beneficial as well. Icing regimen. Follow-up again in 3-4 weeks.

## 2015-08-01 NOTE — Progress Notes (Signed)
[facility] [address], [place] [address] Phone: [phone] Subjective:    I'm seeing this patient by the request  of:  Ann Held, DO   CC: Chronic back pain   [id number] William Holt is a 30 y.o. male coming in with complaint of chronic back pain. Patient is had this for many years. 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 Continues to do a lot of repetitive motions and has a severe pain in the thoracolumbar junction. Seems to be bilateral.  Denies any radiation down the legs or arms. States though that he can be severe where he cannot get out of bed. Pain is even at night. Does not respond to anti-inflammatories but has responded to pain medications. Rates the severity of pain when it occurs a 7 out of 10. Today feeling relatively well.     Past Medical History  Diagnosis Date  . IBS (irritable bowel syndrome)    Past Surgical History  Procedure Laterality Date  . 2 other hand surgeries,2006, 2002    . Artery repair Right 01/26/2014    Procedure: right thumb exploratio and repair artery;  Surgeon: Daryll Brod, MD;  Location: [place];  Service: Orthopedics;  Laterality: Right;   Social History   Social History  . Marital Status: Single    Spouse Name: N/A  . Number of Children: N/A  . Years of Education: N/A   Social History Main Topics  . Smoking status: Never Smoker   . Smokeless tobacco: None  . Alcohol Use: Yes     Comment: social  . Drug Use: No  . Sexual Activity: Not Asked   Other Topics Concern  . None   Social History Narrative   No Known Allergies No family history on file.  Past medical history, social, surgical and family history all reviewed in electronic medical record.  No pertanent information unless stated regarding to the chief complaint.   Review of Systems: No headache, visual changes, nausea, vomiting, diarrhea,  constipation, dizziness, abdominal pain, skin rash, fevers, chills, night sweats, weight loss, swollen lymph nodes, body aches, joint swelling, muscle aches, chest pain, shortness of breath, mood changes.   Objective Blood pressure 122/74, pulse 71, height 6\' 1"  (1.854 m), weight 217 lb (98.431 kg), SpO2 98 %.  General: No apparent distress alert and oriented x3 mood and affect normal, dressed appropriately.  HEENT: Pupils equal, extraocular movements intact  Respiratory: Patient's speak in full sentences and does not appear short of breath  Cardiovascular: No lower extremity edema, non tender, no erythema  Skin: Warm dry intact with no signs of infection or rash on extremities or on axial skeleton.  Abdomen: Soft nontender  Neuro: Cranial nerves II through XII are intact, neurovascularly intact in all extremities with 2+ DTRs and 2+ pulses.  Lymph: No lymphadenopathy of posterior or anterior cervical chain or axillae bilaterally.  Gait normal with good balance and coordination.  MSK:  Non tender with full range of motion and good stability and symmetric strength and tone of shoulders, elbows, wrist, hip, knee and ankles bilaterally.  Back Exam:  Inspection: Very mild scoliosis noted of the right thoracic spine. Motion: Flexion 45 deg, Extension 15 deg, Side Bending to 45 deg bilaterally,  Rotation to 45 deg bilaterally  SLR laying: Negative  XSLR laying: Negative  Palpable tenderness: Tender to palpation more in  the thoracolumbar juncture on the right sign. Tightness of the hip flexors bilaterally FABER: negative. Sensory change: Gross sensation intact to all lumbar and sacral dermatomes.  Reflexes: 2+ at both patellar tendons, 2+ at achilles tendons, Babinski's downgoing.  Strength at foot  Plantar-flexion: 5/5 Dorsi-flexion: 5/5 Eversion: 5/5 Inversion: 5/5  Leg strength  Quad: 5/5 Hamstring: 5/5 Hip flexor: 5/5 Hip abductors: 5/5  Gait unremarkable. Scapular dyskinesia noted on the  right side.  Osteopathic findings C4 flexed rotated and side bent right T3 extended rotated and side bent right T to extended rotated and side bent left L2 flexed rotated inside that right  Procedure note 97110; 15 minutes spent for Therapeutic exercises as stated in above notes.  This included exercises focusing on stretching, strengthening, with significant focus on eccentric aspects.   Basic scapular stabilization to include adduction and depression of scapula Scaption, focusing on proper movement and good control Rows with theraband   Proper technique shown and discussed handout in great detail with ATC.  All questions were discussed and answered.     Impression and Recommendations:     This case required medical decision making of moderate complexity.      Note: This dictation was prepared with Dragon dictation along with smaller phrase technology. Any transcriptional errors that result from this process are unintentional.

## 2015-08-01 NOTE — Patient Instructions (Signed)
Good to see you  Ice is your friend Ice 20 minutes 2 times daily. Usually after activity and before bed. Meloxicam daily for 10 days then as needed Zanaflex at night if needed Exercises 3 times a week.  On wall with heels, butt shoulder and head touching for a goal of 5 minutes daily  Work on keeping your shoulders back when you think about it.  See me again in 3-4 weeks and we will pop you again.

## 2015-08-30 ENCOUNTER — Ambulatory Visit: Payer: 59 | Admitting: Family Medicine

## 2016-02-07 ENCOUNTER — Inpatient Hospital Stay (HOSPITAL_COMMUNITY)
Admission: EM | Admit: 2016-02-07 | Discharge: 2016-02-10 | DRG: 089 | Disposition: A | Discharge status: Home Health | Payer: 59 | Attending: Orthopedic Surgery | Admitting: Orthopedic Surgery

## 2016-02-07 ENCOUNTER — Inpatient Hospital Stay (HOSPITAL_COMMUNITY): Payer: 59

## 2016-02-07 ENCOUNTER — Encounter (HOSPITAL_COMMUNITY): Payer: Self-pay

## 2016-02-07 ENCOUNTER — Emergency Department (HOSPITAL_COMMUNITY): Payer: 59

## 2016-02-07 DIAGNOSIS — S329XXA Fracture of unspecified parts of lumbosacral spine and pelvis, initial encounter for closed fracture: Secondary | ICD-10-CM | POA: Diagnosis not present

## 2016-02-07 DIAGNOSIS — W132XXA Fall from, out of or through roof, initial encounter: Secondary | ICD-10-CM | POA: Diagnosis present

## 2016-02-07 DIAGNOSIS — S060X1A Concussion with loss of consciousness of 30 minutes or less, initial encounter: Principal | ICD-10-CM | POA: Diagnosis present

## 2016-02-07 DIAGNOSIS — S62102A Fracture of unspecified carpal bone, left wrist, initial encounter for closed fracture: Secondary | ICD-10-CM | POA: Diagnosis not present

## 2016-02-07 DIAGNOSIS — S52121A Displaced fracture of head of right radius, initial encounter for closed fracture: Secondary | ICD-10-CM | POA: Diagnosis not present

## 2016-02-07 DIAGNOSIS — Z23 Encounter for immunization: Secondary | ICD-10-CM | POA: Diagnosis not present

## 2016-02-07 DIAGNOSIS — S3993XA Unspecified injury of pelvis, initial encounter: Secondary | ICD-10-CM | POA: Diagnosis not present

## 2016-02-07 DIAGNOSIS — R51 Headache: Secondary | ICD-10-CM | POA: Diagnosis present

## 2016-02-07 DIAGNOSIS — S52124A Nondisplaced fracture of head of right radius, initial encounter for closed fracture: Secondary | ICD-10-CM | POA: Diagnosis present

## 2016-02-07 DIAGNOSIS — S3210XA Unspecified fracture of sacrum, initial encounter for closed fracture: Secondary | ICD-10-CM | POA: Diagnosis present

## 2016-02-07 DIAGNOSIS — M7989 Other specified soft tissue disorders: Secondary | ICD-10-CM | POA: Diagnosis not present

## 2016-02-07 DIAGNOSIS — M25522 Pain in left elbow: Secondary | ICD-10-CM | POA: Diagnosis not present

## 2016-02-07 DIAGNOSIS — S5291XA Unspecified fracture of right forearm, initial encounter for closed fracture: Secondary | ICD-10-CM | POA: Diagnosis not present

## 2016-02-07 DIAGNOSIS — S4991XA Unspecified injury of right shoulder and upper arm, initial encounter: Secondary | ICD-10-CM | POA: Diagnosis not present

## 2016-02-07 DIAGNOSIS — S060X9A Concussion with loss of consciousness of unspecified duration, initial encounter: Secondary | ICD-10-CM | POA: Diagnosis not present

## 2016-02-07 DIAGNOSIS — S0010XA Contusion of unspecified eyelid and periocular area, initial encounter: Secondary | ICD-10-CM | POA: Diagnosis not present

## 2016-02-07 DIAGNOSIS — S22019A Unspecified fracture of first thoracic vertebra, initial encounter for closed fracture: Secondary | ICD-10-CM | POA: Diagnosis present

## 2016-02-07 DIAGNOSIS — S0291XA Unspecified fracture of skull, initial encounter for closed fracture: Secondary | ICD-10-CM | POA: Diagnosis not present

## 2016-02-07 DIAGNOSIS — S060XAA Concussion with loss of consciousness status unknown, initial encounter: Secondary | ICD-10-CM | POA: Diagnosis present

## 2016-02-07 DIAGNOSIS — S6992XA Unspecified injury of left wrist, hand and finger(s), initial encounter: Secondary | ICD-10-CM | POA: Diagnosis not present

## 2016-02-07 DIAGNOSIS — R339 Retention of urine, unspecified: Secondary | ICD-10-CM | POA: Diagnosis not present

## 2016-02-07 DIAGNOSIS — S022XXA Fracture of nasal bones, initial encounter for closed fracture: Secondary | ICD-10-CM | POA: Diagnosis present

## 2016-02-07 DIAGNOSIS — S299XXA Unspecified injury of thorax, initial encounter: Secondary | ICD-10-CM | POA: Diagnosis not present

## 2016-02-07 DIAGNOSIS — S59902A Unspecified injury of left elbow, initial encounter: Secondary | ICD-10-CM | POA: Diagnosis not present

## 2016-02-07 DIAGNOSIS — S020XXA Fracture of vault of skull, initial encounter for closed fracture: Secondary | ICD-10-CM | POA: Diagnosis present

## 2016-02-07 DIAGNOSIS — W19XXXA Unspecified fall, initial encounter: Secondary | ICD-10-CM

## 2016-02-07 DIAGNOSIS — S3289XA Fracture of other parts of pelvis, initial encounter for closed fracture: Secondary | ICD-10-CM | POA: Diagnosis not present

## 2016-02-07 DIAGNOSIS — R079 Chest pain, unspecified: Secondary | ICD-10-CM | POA: Diagnosis not present

## 2016-02-07 DIAGNOSIS — S0285XA Fracture of orbit, unspecified, initial encounter for closed fracture: Secondary | ICD-10-CM | POA: Diagnosis present

## 2016-02-07 DIAGNOSIS — S0990XA Unspecified injury of head, initial encounter: Secondary | ICD-10-CM | POA: Diagnosis not present

## 2016-02-07 DIAGNOSIS — S069XAA Unspecified intracranial injury with loss of consciousness status unknown, initial encounter: Secondary | ICD-10-CM | POA: Diagnosis present

## 2016-02-07 DIAGNOSIS — R109 Unspecified abdominal pain: Secondary | ICD-10-CM | POA: Diagnosis not present

## 2016-02-07 DIAGNOSIS — S3991XA Unspecified injury of abdomen, initial encounter: Secondary | ICD-10-CM | POA: Diagnosis not present

## 2016-02-07 DIAGNOSIS — S0083XA Contusion of other part of head, initial encounter: Secondary | ICD-10-CM | POA: Diagnosis not present

## 2016-02-07 DIAGNOSIS — S199XXA Unspecified injury of neck, initial encounter: Secondary | ICD-10-CM | POA: Diagnosis not present

## 2016-02-07 DIAGNOSIS — S32512A Fracture of superior rim of left pubis, initial encounter for closed fracture: Secondary | ICD-10-CM | POA: Diagnosis present

## 2016-02-07 DIAGNOSIS — S0219XA Other fracture of base of skull, initial encounter for closed fracture: Secondary | ICD-10-CM | POA: Diagnosis not present

## 2016-02-07 DIAGNOSIS — S52571A Other intraarticular fracture of lower end of right radius, initial encounter for closed fracture: Secondary | ICD-10-CM | POA: Diagnosis not present

## 2016-02-07 DIAGNOSIS — IMO0002 Reserved for concepts with insufficient information to code with codable children: Secondary | ICD-10-CM

## 2016-02-07 DIAGNOSIS — S3282XA Multiple fractures of pelvis without disruption of pelvic ring, initial encounter for closed fracture: Secondary | ICD-10-CM | POA: Diagnosis present

## 2016-02-07 DIAGNOSIS — S0292XA Unspecified fracture of facial bones, initial encounter for closed fracture: Secondary | ICD-10-CM | POA: Diagnosis not present

## 2016-02-07 DIAGNOSIS — S069X9A Unspecified intracranial injury with loss of consciousness of unspecified duration, initial encounter: Secondary | ICD-10-CM

## 2016-02-07 DIAGNOSIS — M25532 Pain in left wrist: Secondary | ICD-10-CM | POA: Diagnosis not present

## 2016-02-07 HISTORY — DX: Gastro-esophageal reflux disease without esophagitis: K21.9

## 2016-02-07 HISTORY — DX: Epigastric pain: R10.13

## 2016-02-07 LAB — URINALYSIS, ROUTINE W REFLEX MICROSCOPIC
Bilirubin Urine: NEGATIVE
Glucose, UA: NEGATIVE mg/dL
Hgb urine dipstick: NEGATIVE
KETONES UR: 20 mg/dL — AB
LEUKOCYTES UA: NEGATIVE
Nitrite: NEGATIVE
PH: 6 (ref 5.0–8.0)
Protein, ur: NEGATIVE mg/dL
Specific Gravity, Urine: >1.046 — ABNORMAL HIGH (ref 1.005–1.030)

## 2016-02-07 LAB — CBC WITH DIFFERENTIAL/PLATELET
BASOS ABS: 0 10*3/uL (ref 0.0–0.1)
BASOS PCT: 0 %
EOS ABS: 0.1 10*3/uL (ref 0.0–0.7)
Eosinophils Relative: 2 %
HCT: 43.2 % (ref 39.0–52.0)
HEMOGLOBIN: 15.6 g/dL (ref 13.0–17.0)
Lymphocytes Relative: 25 %
Lymphs Abs: 1.6 10*3/uL (ref 0.7–4.0)
MCH: 29.5 pg (ref 26.0–34.0)
MCHC: 36.1 g/dL — ABNORMAL HIGH (ref 30.0–36.0)
MCV: 81.8 fL (ref 78.0–100.0)
Monocytes Absolute: 0.7 10*3/uL (ref 0.1–1.0)
Monocytes Relative: 12 %
NEUTROS PCT: 61 %
Neutro Abs: 3.8 10*3/uL (ref 1.7–7.7)
Platelets: 164 10*3/uL (ref 150–400)
RBC: 5.28 MIL/uL (ref 4.22–5.81)
RDW: 12.1 % (ref 11.5–15.5)
WBC: 6.3 10*3/uL (ref 4.0–10.5)

## 2016-02-07 LAB — COMPREHENSIVE METABOLIC PANEL
ALBUMIN: 4.4 g/dL (ref 3.5–5.0)
ALK PHOS: 65 U/L (ref 38–126)
ALT: 32 U/L (ref 17–63)
ANION GAP: 11 (ref 5–15)
AST: 38 U/L (ref 15–41)
BUN: 10 mg/dL (ref 6–20)
CALCIUM: 9.2 mg/dL (ref 8.9–10.3)
CO2: 25 mmol/L (ref 22–32)
Chloride: 103 mmol/L (ref 101–111)
Creatinine, Ser: 1.01 mg/dL (ref 0.61–1.24)
GFR calc Af Amer: >60 mL/min (ref >60)
GFR calc non Af Amer: >60 mL/min (ref >60)
GLUCOSE: 125 mg/dL — AB (ref 65–99)
Potassium: 3.2 mmol/L — ABNORMAL LOW (ref 3.5–5.1)
SODIUM: 139 mmol/L (ref 135–145)
Total Bilirubin: 1 mg/dL (ref 0.3–1.2)
Total Protein: 7.1 g/dL (ref 6.5–8.1)

## 2016-02-07 LAB — RAPID URINE DRUG SCREEN, HOSP PERFORMED
Amphetamines: NOT DETECTED
BARBITURATES: NOT DETECTED
BENZODIAZEPINES: NOT DETECTED
COCAINE: NOT DETECTED
Opiates: POSITIVE — AB
TETRAHYDROCANNABINOL: POSITIVE — AB

## 2016-02-07 LAB — ETHANOL: Alcohol, Ethyl (B): <5 mg/dL (ref <5)

## 2016-02-07 MED ORDER — POTASSIUM CHLORIDE IN NACL 20-0.45 MEQ/L-% IV SOLN
INTRAVENOUS | Status: DC
  Administered 2016-02-07: 20:00:00 via INTRAVENOUS
  Filled 2016-02-07: qty 1000

## 2016-02-07 MED ORDER — FAMOTIDINE 20 MG PO TABS
20.0000 mg | ORAL_TABLET | Freq: Two times a day (BID) | ORAL | Status: DC
  Administered 2016-02-07 – 2016-02-10 (×7): 20 mg via ORAL
  Filled 2016-02-07 (×7): qty 1

## 2016-02-07 MED ORDER — OXYCODONE HCL 5 MG PO TABS: 5.0000 mg | ORAL_TABLET | ORAL | Status: DC | PRN

## 2016-02-07 MED ORDER — HYDROMORPHONE HCL 2 MG/ML IJ SOLN
1.0000 mg | Freq: Once | INTRAMUSCULAR | Status: AC
  Administered 2016-02-07: 1 mg via INTRAVENOUS
  Filled 2016-02-07: qty 1

## 2016-02-07 MED ORDER — ONDANSETRON HCL 4 MG/2ML IJ SOLN
4.0000 mg | Freq: Four times a day (QID) | INTRAMUSCULAR | Status: DC | PRN
Start: 2016-02-07 — End: 2016-02-09
  Administered 2016-02-07 – 2016-02-09 (×6): 4 mg via INTRAVENOUS
  Filled 2016-02-07 (×6): qty 2

## 2016-02-07 MED ORDER — MORPHINE SULFATE (PF) 4 MG/ML IV SOLN: 2.0000 mg | INTRAVENOUS | Status: DC | PRN

## 2016-02-07 MED ORDER — ONDANSETRON HCL 4 MG/2ML IJ SOLN
4.0000 mg | Freq: Once | INTRAMUSCULAR | Status: AC
  Administered 2016-02-07: 4 mg via INTRAVENOUS
  Filled 2016-02-07: qty 2

## 2016-02-07 MED ORDER — FENTANYL CITRATE (PF) 100 MCG/2ML IJ SOLN
100.0000 ug | Freq: Once | INTRAMUSCULAR | Status: AC
  Administered 2016-02-07: 100 ug via INTRAVENOUS
  Filled 2016-02-07: qty 2

## 2016-02-07 MED ORDER — OXYCODONE HCL 5 MG PO TABS
5.0000 mg | ORAL_TABLET | ORAL | Status: DC | PRN
  Administered 2016-02-07 (×2): 10 mg via ORAL
  Administered 2016-02-08: 15 mg via ORAL
  Administered 2016-02-08: 10 mg via ORAL
  Administered 2016-02-08: 15 mg via ORAL
  Administered 2016-02-08: 10 mg via ORAL
  Administered 2016-02-08 – 2016-02-10 (×4): 15 mg via ORAL
  Filled 2016-02-07: qty 3
  Filled 2016-02-07: qty 2
  Filled 2016-02-07 (×2): qty 3
  Filled 2016-02-07: qty 2
  Filled 2016-02-07 (×3): qty 3
  Filled 2016-02-07: qty 2

## 2016-02-07 MED ORDER — KCL IN DEXTROSE-NACL 20-5-0.9 MEQ/L-%-% IV SOLN
INTRAVENOUS | Status: DC
  Administered 2016-02-08 (×2): via INTRAVENOUS
  Filled 2016-02-07 (×2): qty 1000

## 2016-02-07 MED ORDER — ONDANSETRON HCL 4 MG PO TABS: 4.0000 mg | ORAL_TABLET | Freq: Four times a day (QID) | ORAL | Status: DC | PRN

## 2016-02-07 MED ORDER — IOPAMIDOL (ISOVUE-300) INJECTION 61%
INTRAVENOUS | Status: AC
  Administered 2016-02-07: 100 mL
  Filled 2016-02-07: qty 100

## 2016-02-07 MED ORDER — HYDROMORPHONE HCL 2 MG/ML IJ SOLN
0.5000 mg | INTRAMUSCULAR | Status: DC | PRN
  Administered 2016-02-07 – 2016-02-08 (×3): 0.5 mg via INTRAVENOUS
  Filled 2016-02-07 (×3): qty 1

## 2016-02-07 NOTE — Progress Notes (Signed)
Orthopedic Tech Progress Note Patient Details:  William Holt [birth year] [id number]  Ortho Devices Type of Ortho Device: Ace wrap, Long arm splint Ortho Device/Splint Location: rue Ortho Device/Splint Interventions: Application   Yissel Habermehl 02/07/2016, 1:26 PM

## 2016-02-07 NOTE — Consult Note (Signed)
Reason for Consult:facial fracture Referring Physician: Trauma  William Holt is an 30 y.o. male.  HPI: 31 year old male fell from a ladder about 30 feet earlier today and lost consciousness.  He had some amnesia to the event.  He was brought to the ER by EMS as a level 2 trauma.  He complained of headache, pelvic pain, and right elbow pain.  These symptoms continue.  History reviewed. No pertinent past medical history.  History reviewed. No pertinent surgical history.  No family history on file.  Social History:  reports that he has never smoked. He has never used smokeless tobacco. He reports that he does not drink alcohol. His drug history is not on file.  Allergies: No Known Allergies  Medications: I have reviewed the patient's current medications.  Results for orders placed or performed during the hospital encounter of 02/07/16 (from the past 48 hour(s))  Ethanol     Status: None   Collection Time: 02/07/16 11:14 AM  Result Value Ref Range   Alcohol, Ethyl (B) <5 <5 mg/dL    Comment:        LOWEST DETECTABLE LIMIT FOR SERUM ALCOHOL IS 5 mg/dL FOR MEDICAL PURPOSES ONLY   CBC with Differential     Status: Abnormal   Collection Time: 02/07/16 11:15 AM  Result Value Ref Range   WBC 6.3 4.0 - 10.5 K/uL   RBC 5.28 4.22 - 5.81 MIL/uL   Hemoglobin 15.6 13.0 - 17.0 g/dL   HCT 43.2 39.0 - 52.0 %   MCV 81.8 78.0 - 100.0 fL   MCH 29.5 26.0 - 34.0 pg   MCHC 36.1 (H) 30.0 - 36.0 g/dL   RDW 12.1 11.5 - 15.5 %   Platelets 164 150 - 400 K/uL   Neutrophils Relative % 61 %   Neutro Abs 3.8 1.7 - 7.7 K/uL   Lymphocytes Relative 25 %   Lymphs Abs 1.6 0.7 - 4.0 K/uL   Monocytes Relative 12 %   Monocytes Absolute 0.7 0.1 - 1.0 K/uL   Eosinophils Relative 2 %   Eosinophils Absolute 0.1 0.0 - 0.7 K/uL   Basophils Relative 0 %   Basophils Absolute 0.0 0.0 - 0.1 K/uL  Comprehensive metabolic panel     Status: Abnormal   Collection Time: 02/07/16 11:15 AM  Result Value Ref Range   Sodium  139 135 - 145 mmol/L   Potassium 3.2 (L) 3.5 - 5.1 mmol/L   Chloride 103 101 - 111 mmol/L   CO2 25 22 - 32 mmol/L   Glucose, Bld 125 (H) 65 - 99 mg/dL   BUN 10 6 - 20 mg/dL   Creatinine, Ser 1.01 0.61 - 1.24 mg/dL   Calcium 9.2 8.9 - 10.3 mg/dL   Total Protein 7.1 6.5 - 8.1 g/dL   Albumin 4.4 3.5 - 5.0 g/dL   AST 38 15 - 41 U/L   ALT 32 17 - 63 U/L   Alkaline Phosphatase 65 38 - 126 U/L   Total Bilirubin 1.0 0.3 - 1.2 mg/dL   GFR calc non Af Amer >60 >60 mL/min   GFR calc Af Amer >60 >60 mL/min    Comment: (NOTE) The eGFR has been calculated using the CKD EPI equation. This calculation has not been validated in all clinical situations. eGFR's persistently <60 mL/min signify possible Chronic Kidney Disease.    Anion gap 11 5 - 15    Dg Elbow Complete Right  Result Date: 02/07/2016 CLINICAL DATA:  Right elbow pain after  fall off roof. EXAM: RIGHT ELBOW - COMPLETE 3+ VIEW COMPARISON:  None. FINDINGS: Minimally displaced fracture is seen involving the radial head with intra-articular extension. The joint has been splinted. No soft tissue abnormality is noted. IMPRESSION: Minimally displaced radial head fracture with intra-articular extension. Electronically Signed   By: Marijo Conception, M.D.   On: 02/07/2016 14:06   Dg Forearm Right  Result Date: 02/07/2016 CLINICAL DATA:  Fall. EXAM: RIGHT FOREARM - 2 VIEW COMPARISON:  No prior. FINDINGS: Angulated displaced fracture of the radial head is noted. Fracture extends into the radiocarpal joint space. No other focal abnormality identified. IMPRESSION: Angulated displaced fracture of the radial head. Electronically Signed   By: Marcello Moores  Register   On: 02/07/2016 12:19   Ct Head Wo Contrast  Result Date: 02/07/2016 CLINICAL DATA:  Status post fall from [place] height of 2 stories today. Initial encounter. EXAM: CT HEAD WITHOUT CONTRAST CT CERVICAL SPINE WITHOUT CONTRAST TECHNIQUE: Multidetector CT imaging of the head and cervical spine was  performed following the standard protocol without intravenous contrast. Multiplanar CT image reconstructions of the cervical spine were also generated. COMPARISON:  None. FINDINGS: CT HEAD FINDINGS Brain: Appears normal without hemorrhage, infarct, mass lesion, mass effect, midline shift or abnormal extra-axial fluid collection. No hydrocephalus or pneumocephalus. Vascular: Appear normal. Skull: The patient has right nasal bone fractures with slight impaction. Nondisplaced fracture of the right orbital roof is identified. The fracture extends into the right frontal bone without displacement. Sinuses/Orbits: Mild mucosal thickening left sphenoid sinus is identified. There is also scattered ethmoid air cell disease. Other: None. CT CERVICAL SPINE FINDINGS Alignment: Maintained. Skull base and vertebrae: Mild superior endplate compression fracture of T1 eccentric to the left is identified. Vertebral body height loss is mild at approximately 15%. No extension into the posterior elements is identified. No other fracture is identified. Soft tissues and spinal canal: New Disc levels: 1 central canal and foramina appear open. Minimal disc bulging in the midcervical spine is noted. Upper chest: Negative. Other: None. IMPRESSION: Fractures of the right nasal bone, right orbital roof and right frontal bone. Negative for acute intracranial abnormality. Mild superior endplate compression fracture of T1. No involvement of the posterior elements or central canal compromise is identified. Critical Value/emergent results were called by telephone at the time of interpretation on 02/07/2016 at 12:29 pm to Dr. Merrily Pew , who verbally acknowledged these results. Electronically Signed   By: Inge Rise M.D.   On: 02/07/2016 12:32   Ct Chest W Contrast  Result Date: 02/07/2016 CLINICAL DATA:  Pain following trauma/fall EXAM: CT CHEST, ABDOMEN, AND PELVIS WITH CONTRAST TECHNIQUE: Multidetector CT imaging of the chest, abdomen  and pelvis was performed following the standard protocol during bolus administration of intravenous contrast. CONTRAST:  147m ISOVUE-300 IOPAMIDOL (ISOVUE-300) INJECTION 61% COMPARISON:  None. FINDINGS: CT CHEST FINDINGS Cardiovascular: There is no demonstrable mediastinal hematoma. There is no thoracic aortic aneurysm or dissection. No aortic mucosal lesion or periaortic fluid evident. No evident contrast extravasation. The visualized great vessels appear unremarkable. The pericardium is not appreciably thickened. Mediastinum/Nodes: Visualized thyroid appears unremarkable. There is no appreciable thoracic adenopathy. Lungs/Pleura: There is no lung edema or consolidation. No pneumothorax or contusion. There is slight bibasilar atelectasis. There is no pleural effusion or pleural thickening evident. Musculoskeletal: There is no fracture or dislocation evident in thoracic region. No blastic or lytic bone lesions. No chest wall lesions evident. CT ABDOMEN PELVIS FINDINGS Hepatobiliary: Liver appears intact without laceration or rupture. No perihepatic  fluid. No focal liver lesions are evident. Gallbladder wall is not appreciably thickened. There is no biliary duct dilatation. Pancreas: Pancreas appears intact. No peripancreatic fluid. No pancreatic mass or inflammatory focus. Spleen: Spleen appears intact without laceration or rupture. No perisplenic fluid. No focal splenic lesions. Adrenals/Urinary Tract: Adrenals appear normal bilaterally. Kidneys bilaterally show no evidence of mass or hydronephrosis. There is no perinephric fluid or stranding. No contrast extravasation. No renal laceration or rupture. No renal or ureteral calculus is evident on either side. Urinary bladder is midline with wall thickness within normal limits. Stomach/Bowel: There is no appreciable bowel wall or mesenteric thickening. There is no bowel obstruction. No free air or portal venous air. Vascular/Lymphatic: Aorta appears intact. There is  no periaortic fluid. No abdominal aortic aneurysm. Major mesenteric vessels appear patent. No vascular lesions are evident. There is no adenopathy in the abdomen or pelvis. Reproductive: Prostate and seminal vesicles appear normal in size and contour. There are a few tiny prostatic calculi. There is no pelvic mass or pelvic fluid collection. Other: The appendix appears normal. There is no ascites or abscess in the abdomen or pelvis. There are no intraperitoneal or retroperitoneal fluid collections in the abdomen or pelvis. Musculoskeletal: There is a comminuted fracture of the right sacral ala. Mild anterior displacement of a fracture fragment along the anterior sacral ala on the right is noted. There is a fracture of the superior left pubic symphysis in near anatomic alignment. There is a fracture of the each anterior acetabular region without fracture fragments extending into the respective hip joints. No other fractures are evident. No dislocations. There are several Schmorl's nodes in the upper lumbar region, an anatomic variant. There are no blastic or lytic bone lesions. There is no intramuscular or abdominal wall lesion. IMPRESSION: CT chest: No vascular lesion evident. No fracture or dislocation. Slight bibasilar atelectasis without parenchymal lung contusion or pneumothorax. No edema or consolidation. No adenopathy. CT abdomen and pelvis: Comminuted fracture right sacral ala. Fracture along the superior left pubic symphysis in near anatomic alignment. Fractures of each anterior acetabular region without fracture fragments extending into the respective hip joints. Visceral structures appear intact without laceration or rupture. No bowel wall thickening or bowel obstruction. No abscess. No abnormal fluid collections. Appendix appears normal. No renal or ureteral calculi. No hydronephrosis. There are a few tiny prostatic calculi. No vascular lesion evident. Electronically Signed   By: Lowella Grip III  M.D.   On: 02/07/2016 12:24   Ct Cervical Spine Wo Contrast  Result Date: 02/07/2016 CLINICAL DATA:  Status post fall from [place] height of 2 stories today. Initial encounter. EXAM: CT HEAD WITHOUT CONTRAST CT CERVICAL SPINE WITHOUT CONTRAST TECHNIQUE: Multidetector CT imaging of the head and cervical spine was performed following the standard protocol without intravenous contrast. Multiplanar CT image reconstructions of the cervical spine were also generated. COMPARISON:  None. FINDINGS: CT HEAD FINDINGS Brain: Appears normal without hemorrhage, infarct, mass lesion, mass effect, midline shift or abnormal extra-axial fluid collection. No hydrocephalus or pneumocephalus. Vascular: Appear normal. Skull: The patient has right nasal bone fractures with slight impaction. Nondisplaced fracture of the right orbital roof is identified. The fracture extends into the right frontal bone without displacement. Sinuses/Orbits: Mild mucosal thickening left sphenoid sinus is identified. There is also scattered ethmoid air cell disease. Other: None. CT CERVICAL SPINE FINDINGS Alignment: Maintained. Skull base and vertebrae: Mild superior endplate compression fracture of T1 eccentric to the left is identified. Vertebral body height loss is mild at  approximately 15%. No extension into the posterior elements is identified. No other fracture is identified. Soft tissues and spinal canal: New Disc levels: 1 central canal and foramina appear open. Minimal disc bulging in the midcervical spine is noted. Upper chest: Negative. Other: None. IMPRESSION: Fractures of the right nasal bone, right orbital roof and right frontal bone. Negative for acute intracranial abnormality. Mild superior endplate compression fracture of T1. No involvement of the posterior elements or central canal compromise is identified. Critical Value/emergent results were called by telephone at the time of interpretation on 02/07/2016 at 12:29 pm to Dr. Merrily Pew ,  who verbally acknowledged these results. Electronically Signed   By: Inge Rise M.D.   On: 02/07/2016 12:32   Ct Abdomen Pelvis W Contrast  Result Date: 02/07/2016 CLINICAL DATA:  Pain following trauma/fall EXAM: CT CHEST, ABDOMEN, AND PELVIS WITH CONTRAST TECHNIQUE: Multidetector CT imaging of the chest, abdomen and pelvis was performed following the standard protocol during bolus administration of intravenous contrast. CONTRAST:  175m ISOVUE-300 IOPAMIDOL (ISOVUE-300) INJECTION 61% COMPARISON:  None. FINDINGS: CT CHEST FINDINGS Cardiovascular: There is no demonstrable mediastinal hematoma. There is no thoracic aortic aneurysm or dissection. No aortic mucosal lesion or periaortic fluid evident. No evident contrast extravasation. The visualized great vessels appear unremarkable. The pericardium is not appreciably thickened. Mediastinum/Nodes: Visualized thyroid appears unremarkable. There is no appreciable thoracic adenopathy. Lungs/Pleura: There is no lung edema or consolidation. No pneumothorax or contusion. There is slight bibasilar atelectasis. There is no pleural effusion or pleural thickening evident. Musculoskeletal: There is no fracture or dislocation evident in thoracic region. No blastic or lytic bone lesions. No chest wall lesions evident. CT ABDOMEN PELVIS FINDINGS Hepatobiliary: Liver appears intact without laceration or rupture. No perihepatic fluid. No focal liver lesions are evident. Gallbladder wall is not appreciably thickened. There is no biliary duct dilatation. Pancreas: Pancreas appears intact. No peripancreatic fluid. No pancreatic mass or inflammatory focus. Spleen: Spleen appears intact without laceration or rupture. No perisplenic fluid. No focal splenic lesions. Adrenals/Urinary Tract: Adrenals appear normal bilaterally. Kidneys bilaterally show no evidence of mass or hydronephrosis. There is no perinephric fluid or stranding. No contrast extravasation. No renal laceration or  rupture. No renal or ureteral calculus is evident on either side. Urinary bladder is midline with wall thickness within normal limits. Stomach/Bowel: There is no appreciable bowel wall or mesenteric thickening. There is no bowel obstruction. No free air or portal venous air. Vascular/Lymphatic: Aorta appears intact. There is no periaortic fluid. No abdominal aortic aneurysm. Major mesenteric vessels appear patent. No vascular lesions are evident. There is no adenopathy in the abdomen or pelvis. Reproductive: Prostate and seminal vesicles appear normal in size and contour. There are a few tiny prostatic calculi. There is no pelvic mass or pelvic fluid collection. Other: The appendix appears normal. There is no ascites or abscess in the abdomen or pelvis. There are no intraperitoneal or retroperitoneal fluid collections in the abdomen or pelvis. Musculoskeletal: There is a comminuted fracture of the right sacral ala. Mild anterior displacement of a fracture fragment along the anterior sacral ala on the right is noted. There is a fracture of the superior left pubic symphysis in near anatomic alignment. There is a fracture of the each anterior acetabular region without fracture fragments extending into the respective hip joints. No other fractures are evident. No dislocations. There are several Schmorl's nodes in the upper lumbar region, an anatomic variant. There are no blastic or lytic bone lesions. There is no intramuscular or  abdominal wall lesion. IMPRESSION: CT chest: No vascular lesion evident. No fracture or dislocation. Slight bibasilar atelectasis without parenchymal lung contusion or pneumothorax. No edema or consolidation. No adenopathy. CT abdomen and pelvis: Comminuted fracture right sacral ala. Fracture along the superior left pubic symphysis in near anatomic alignment. Fractures of each anterior acetabular region without fracture fragments extending into the respective hip joints. Visceral structures  appear intact without laceration or rupture. No bowel wall thickening or bowel obstruction. No abscess. No abnormal fluid collections. Appendix appears normal. No renal or ureteral calculi. No hydronephrosis. There are a few tiny prostatic calculi. No vascular lesion evident. Electronically Signed   By: Lowella Grip III M.D.   On: 02/07/2016 12:24   Dg Pelvis Portable  Result Date: 02/07/2016 CLINICAL DATA:  Fall from roof.  Extremity pain.  Initial encounter. EXAM: PORTABLE PELVIS 1-2 VIEWS COMPARISON:  None. FINDINGS: No diastasis or displaced fracture. On the second of two views of the pelvis there is a left superior pubic body fracture which favors avulsion injury. Both hips are located. IMPRESSION: Probable avulsion fracture from the superior left pubic body. Electronically Signed   By: Monte Fantasia M.D.   On: 02/07/2016 11:55   Dg Chest Portable 1 View  Result Date: 02/07/2016 CLINICAL DATA:  [place] off 2 story roof. Right chest and arm pain. Initial encounter. EXAM: PORTABLE CHEST 1 VIEW COMPARISON:  None. FINDINGS: The heart size and mediastinal contours are within normal limits. Both lungs are clear. No evidence of pneumothorax or hemothorax. The visualized skeletal structures are unremarkable. IMPRESSION: No active disease. Electronically Signed   By: Earle Gell M.D.   On: 02/07/2016 11:54   Dg Humerus Right  Result Date: 02/07/2016 CLINICAL DATA:  Fall. EXAM: RIGHT HUMERUS - 2+ VIEW COMPARISON:  No prior. FINDINGS: No acute bony or joint abnormality identified. No evidence of fracture or dislocation. IMPRESSION: No acute or focal abnormality. Electronically Signed   By: Marcello Moores  Register   On: 02/07/2016 12:17    Review of Systems  Musculoskeletal: Positive for joint pain (Right elbow and pelvic pain).  Neurological: Positive for headaches.  All other systems reviewed and are negative.  Blood pressure 112/69, pulse 97, temperature 97.9 F (36.6 C), resp. rate 16, height 6'  (1.829 m), weight 200 lb (90.7 kg), SpO2 100 %. Physical Exam  Constitutional: He is oriented to person, place, and time. He appears well-developed and well-nourished. No distress.  HENT:  Head: Normocephalic.  Right Ear: External ear normal.  Left Ear: External ear normal.  Mouth/Throat: Oropharynx is clear and moist.  Right frontal abrasion.  Right superior lateral orbital rim tender without stepoff.  Nose without tenderness or deformity.  TMs intact with aerated middle ears.  Eyes: Conjunctivae and EOM are normal. Pupils are equal, round, and reactive to light.  Right upper lid edema and ecchymosis.  Neck: Normal range of motion. Neck supple.  Cardiovascular: Normal rate.   Respiratory: Effort normal.  Neurological: He is alert and oriented to person, place, and time. No cranial nerve deficit.  Skin: Skin is warm and dry.  Psychiatric: He has a normal mood and affect. His behavior is normal. Judgment and thought content normal.    Assessment/Plan: Right orbital roof/frontal bone fracture and right nasal fracture, both non-displaced. I personally reviewed his head CT demonstrating the above findings.  Neither fracture requires intervention and should heal without problems.  Call with questions.  William Holt 02/07/2016, 7:31 PM

## 2016-02-07 NOTE — ED Triage Notes (Signed)
Pt fell from second story

## 2016-02-07 NOTE — ED Notes (Signed)
Pt back from CT family at bedside pt remains a/a orient x4

## 2016-02-07 NOTE — H&P (Signed)
William Holt is an 30 y.o. male.   Chief Complaint: Fall from roof HPI: Vernel fell from a second story roof. He was on a ladder working when it gave way. He hit the first story then hit the ground. He remembers the former but not the latter. There was a loss of consciousness. He was brought in as a level 2 trauma activation. He c/o HA, right elbow, and pelvic pain.  History reviewed. No pertinent past medical history.  History reviewed. No pertinent surgical history.  No family history on file. Social History:  reports that he has never smoked. He has never used smokeless tobacco. He reports that he does not drink alcohol. His drug history is not on file.  Allergies: No Known Allergies  Results for orders placed or performed during the hospital encounter of 02/07/16 (from the past 48 hour(s))  Ethanol     Status: None   Collection Time: 02/07/16 11:14 AM  Result Value Ref Range   Alcohol, Ethyl (B) <5 <5 mg/dL    Comment:        LOWEST DETECTABLE LIMIT FOR SERUM ALCOHOL IS 5 mg/dL FOR MEDICAL PURPOSES ONLY   CBC with Differential     Status: Abnormal   Collection Time: 02/07/16 11:15 AM  Result Value Ref Range   WBC 6.3 4.0 - 10.5 K/uL   RBC 5.28 4.22 - 5.81 MIL/uL   Hemoglobin 15.6 13.0 - 17.0 g/dL   HCT 43.2 39.0 - 52.0 %   MCV 81.8 78.0 - 100.0 fL   MCH 29.5 26.0 - 34.0 pg   MCHC 36.1 (H) 30.0 - 36.0 g/dL   RDW 12.1 11.5 - 15.5 %   Platelets 164 150 - 400 K/uL   Neutrophils Relative % 61 %   Neutro Abs 3.8 1.7 - 7.7 K/uL   Lymphocytes Relative 25 %   Lymphs Abs 1.6 0.7 - 4.0 K/uL   Monocytes Relative 12 %   Monocytes Absolute 0.7 0.1 - 1.0 K/uL   Eosinophils Relative 2 %   Eosinophils Absolute 0.1 0.0 - 0.7 K/uL   Basophils Relative 0 %   Basophils Absolute 0.0 0.0 - 0.1 K/uL  Comprehensive metabolic panel     Status: Abnormal   Collection Time: 02/07/16 11:15 AM  Result Value Ref Range   Sodium 139 135 - 145 mmol/L   Potassium 3.2 (L) 3.5 - 5.1 mmol/L   Chloride  103 101 - 111 mmol/L   CO2 25 22 - 32 mmol/L   Glucose, Bld 125 (H) 65 - 99 mg/dL   BUN 10 6 - 20 mg/dL   Creatinine, Ser 1.01 0.61 - 1.24 mg/dL   Calcium 9.2 8.9 - 10.3 mg/dL   Total Protein 7.1 6.5 - 8.1 g/dL   Albumin 4.4 3.5 - 5.0 g/dL   AST 38 15 - 41 U/L   ALT 32 17 - 63 U/L   Alkaline Phosphatase 65 38 - 126 U/L   Total Bilirubin 1.0 0.3 - 1.2 mg/dL   GFR calc non Af Amer >60 >60 mL/min   GFR calc Af Amer >60 >60 mL/min    Comment: (NOTE) The eGFR has been calculated using the CKD EPI equation. This calculation has not been validated in all clinical situations. eGFR's persistently <60 mL/min signify possible Chronic Kidney Disease.    Anion gap 11 5 - 15   Dg Forearm Right  Result Date: 02/07/2016 CLINICAL DATA:  Fall. EXAM: RIGHT FOREARM - 2 VIEW COMPARISON:  No prior. FINDINGS: Angulated  displaced fracture of the radial head is noted. Fracture extends into the radiocarpal joint space. No other focal abnormality identified. IMPRESSION: Angulated displaced fracture of the radial head. Electronically Signed   By: Marcello Moores  Register   On: 02/07/2016 12:19   Ct Head Wo Contrast  Result Date: 02/07/2016 CLINICAL DATA:  Status post fall from [place] height of 2 stories today. Initial encounter. EXAM: CT HEAD WITHOUT CONTRAST CT CERVICAL SPINE WITHOUT CONTRAST TECHNIQUE: Multidetector CT imaging of the head and cervical spine was performed following the standard protocol without intravenous contrast. Multiplanar CT image reconstructions of the cervical spine were also generated. COMPARISON:  None. FINDINGS: CT HEAD FINDINGS Brain: Appears normal without hemorrhage, infarct, mass lesion, mass effect, midline shift or abnormal extra-axial fluid collection. No hydrocephalus or pneumocephalus. Vascular: Appear normal. Skull: The patient has right nasal bone fractures with slight impaction. Nondisplaced fracture of the right orbital roof is identified. The fracture extends into the right frontal  bone without displacement. Sinuses/Orbits: Mild mucosal thickening left sphenoid sinus is identified. There is also scattered ethmoid air cell disease. Other: None. CT CERVICAL SPINE FINDINGS Alignment: Maintained. Skull base and vertebrae: Mild superior endplate compression fracture of T1 eccentric to the left is identified. Vertebral body height loss is mild at approximately 15%. No extension into the posterior elements is identified. No other fracture is identified. Soft tissues and spinal canal: New Disc levels: 1 central canal and foramina appear open. Minimal disc bulging in the midcervical spine is noted. Upper chest: Negative. Other: None. IMPRESSION: Fractures of the right nasal bone, right orbital roof and right frontal bone. Negative for acute intracranial abnormality. Mild superior endplate compression fracture of T1. No involvement of the posterior elements or central canal compromise is identified. Critical Value/emergent results were called by telephone at the time of interpretation on 02/07/2016 at 12:29 pm to Dr. Merrily Pew , who verbally acknowledged these results. Electronically Signed   By: Inge Rise M.D.   On: 02/07/2016 12:32   Ct Chest W Contrast  Result Date: 02/07/2016 CLINICAL DATA:  Pain following trauma/fall EXAM: CT CHEST, ABDOMEN, AND PELVIS WITH CONTRAST TECHNIQUE: Multidetector CT imaging of the chest, abdomen and pelvis was performed following the standard protocol during bolus administration of intravenous contrast. CONTRAST:  112m ISOVUE-300 IOPAMIDOL (ISOVUE-300) INJECTION 61% COMPARISON:  None. FINDINGS: CT CHEST FINDINGS Cardiovascular: There is no demonstrable mediastinal hematoma. There is no thoracic aortic aneurysm or dissection. No aortic mucosal lesion or periaortic fluid evident. No evident contrast extravasation. The visualized great vessels appear unremarkable. The pericardium is not appreciably thickened. Mediastinum/Nodes: Visualized thyroid appears  unremarkable. There is no appreciable thoracic adenopathy. Lungs/Pleura: There is no lung edema or consolidation. No pneumothorax or contusion. There is slight bibasilar atelectasis. There is no pleural effusion or pleural thickening evident. Musculoskeletal: There is no fracture or dislocation evident in thoracic region. No blastic or lytic bone lesions. No chest wall lesions evident. CT ABDOMEN PELVIS FINDINGS Hepatobiliary: Liver appears intact without laceration or rupture. No perihepatic fluid. No focal liver lesions are evident. Gallbladder wall is not appreciably thickened. There is no biliary duct dilatation. Pancreas: Pancreas appears intact. No peripancreatic fluid. No pancreatic mass or inflammatory focus. Spleen: Spleen appears intact without laceration or rupture. No perisplenic fluid. No focal splenic lesions. Adrenals/Urinary Tract: Adrenals appear normal bilaterally. Kidneys bilaterally show no evidence of mass or hydronephrosis. There is no perinephric fluid or stranding. No contrast extravasation. No renal laceration or rupture. No renal or ureteral calculus is evident on either  side. Urinary bladder is midline with wall thickness within normal limits. Stomach/Bowel: There is no appreciable bowel wall or mesenteric thickening. There is no bowel obstruction. No free air or portal venous air. Vascular/Lymphatic: Aorta appears intact. There is no periaortic fluid. No abdominal aortic aneurysm. Major mesenteric vessels appear patent. No vascular lesions are evident. There is no adenopathy in the abdomen or pelvis. Reproductive: Prostate and seminal vesicles appear normal in size and contour. There are a few tiny prostatic calculi. There is no pelvic mass or pelvic fluid collection. Other: The appendix appears normal. There is no ascites or abscess in the abdomen or pelvis. There are no intraperitoneal or retroperitoneal fluid collections in the abdomen or pelvis. Musculoskeletal: There is a comminuted  fracture of the right sacral ala. Mild anterior displacement of a fracture fragment along the anterior sacral ala on the right is noted. There is a fracture of the superior left pubic symphysis in near anatomic alignment. There is a fracture of the each anterior acetabular region without fracture fragments extending into the respective hip joints. No other fractures are evident. No dislocations. There are several Schmorl's nodes in the upper lumbar region, an anatomic variant. There are no blastic or lytic bone lesions. There is no intramuscular or abdominal wall lesion. IMPRESSION: CT chest: No vascular lesion evident. No fracture or dislocation. Slight bibasilar atelectasis without parenchymal lung contusion or pneumothorax. No edema or consolidation. No adenopathy. CT abdomen and pelvis: Comminuted fracture right sacral ala. Fracture along the superior left pubic symphysis in near anatomic alignment. Fractures of each anterior acetabular region without fracture fragments extending into the respective hip joints. Visceral structures appear intact without laceration or rupture. No bowel wall thickening or bowel obstruction. No abscess. No abnormal fluid collections. Appendix appears normal. No renal or ureteral calculi. No hydronephrosis. There are a few tiny prostatic calculi. No vascular lesion evident. Electronically Signed   By: Lowella Grip III M.D.   On: 02/07/2016 12:24   Dg Pelvis Portable  Result Date: 02/07/2016 CLINICAL DATA:  Fall from roof.  Extremity pain.  Initial encounter. EXAM: PORTABLE PELVIS 1-2 VIEWS COMPARISON:  None. FINDINGS: No diastasis or displaced fracture. On the second of two views of the pelvis there is a left superior pubic body fracture which favors avulsion injury. Both hips are located. IMPRESSION: Probable avulsion fracture from the superior left pubic body. Electronically Signed   By: Monte Fantasia M.D.   On: 02/07/2016 11:55   Dg Chest Portable 1 View  Result  Date: 02/07/2016 CLINICAL DATA:  [place] off 2 story roof. Right chest and arm pain. Initial encounter. EXAM: PORTABLE CHEST 1 VIEW COMPARISON:  None. FINDINGS: The heart size and mediastinal contours are within normal limits. Both lungs are clear. No evidence of pneumothorax or hemothorax. The visualized skeletal structures are unremarkable. IMPRESSION: No active disease. Electronically Signed   By: Earle Gell M.D.   On: 02/07/2016 11:54   Dg Humerus Right  Result Date: 02/07/2016 CLINICAL DATA:  Fall. EXAM: RIGHT HUMERUS - 2+ VIEW COMPARISON:  No prior. FINDINGS: No acute bony or joint abnormality identified. No evidence of fracture or dislocation. IMPRESSION: No acute or focal abnormality. Electronically Signed   By: Marcello Moores  Register   On: 02/07/2016 12:17    Review of Systems  Constitutional: Negative for weight loss.  HENT: Negative for ear discharge, ear pain, hearing loss and tinnitus.   Eyes: Negative for blurred vision, double vision, photophobia and pain.  Respiratory: Negative for cough, sputum production  and shortness of breath.   Cardiovascular: Negative for chest pain.  Gastrointestinal: Negative for abdominal pain, nausea and vomiting.  Genitourinary: Negative for dysuria, flank pain, frequency and urgency.  Musculoskeletal: Positive for joint pain (Right elbow). Negative for back pain, falls, myalgias and neck pain.  Neurological: Positive for loss of consciousness. Negative for dizziness, tingling, sensory change, focal weakness and headaches.  Endo/Heme/Allergies: Does not bruise/bleed easily.  Psychiatric/Behavioral: Positive for memory loss. Negative for depression and substance abuse. The patient is not nervous/anxious.     Blood pressure 117/80, pulse 82, temperature 97.9 F (36.6 C), resp. rate 15, height 6' (1.829 m), weight 90.7 kg (200 lb), SpO2 100 %. Physical Exam  Vitals reviewed. Constitutional: He is oriented to person, place, and time. He appears  well-developed and well-nourished. He is cooperative. No distress. Cervical collar and nasal cannula in place.  HENT:  Head: Normocephalic. Head is without raccoon's eyes, without Battle's sign, without abrasion, without contusion and without laceration.  Right Ear: Hearing, tympanic membrane, external ear and ear canal normal. No lacerations. No drainage or tenderness. No foreign bodies. Tympanic membrane is not perforated. No hemotympanum.  Left Ear: Hearing, tympanic membrane, external ear and ear canal normal. No lacerations. No drainage or tenderness. No foreign bodies. Tympanic membrane is not perforated. No hemotympanum.  Nose: Nose normal. No nose lacerations, sinus tenderness, nasal deformity or nasal septal hematoma. No epistaxis.  Mouth/Throat: Uvula is midline, oropharynx is clear and moist and mucous membranes are normal. No lacerations. No oropharyngeal exudate.  Eyes: Conjunctivae, EOM and lids are normal. Pupils are equal, round, and reactive to light. Right eye exhibits no discharge. Left eye exhibits no discharge. No scleral icterus.  Neck: Trachea normal and normal range of motion. Neck supple. No JVD present. No spinous process tenderness and no muscular tenderness present. Carotid bruit is not present. No tracheal deviation present. No thyromegaly present.  Cardiovascular: Normal rate, regular rhythm, normal heart sounds, intact distal pulses and normal pulses.  Exam reveals no gallop and no friction rub.   No murmur heard. Respiratory: Effort normal and breath sounds normal. No stridor. No respiratory distress. He has no wheezes. He has no rales. He exhibits no tenderness, no bony tenderness, no laceration and no crepitus.  GI: Soft. Normal appearance. He exhibits no distension. Bowel sounds are decreased. There is tenderness in the left lower quadrant. There is no rigidity, no rebound, no guarding and no CVA tenderness.  Genitourinary: Penis normal.  Musculoskeletal: Normal range  of motion. He exhibits no edema.       Right elbow: Tenderness found. Radial head tenderness noted.  Lymphadenopathy:    He has no cervical adenopathy.  Neurological: He is alert and oriented to person, place, and time. He has normal strength. No cranial nerve deficit or sensory deficit. GCS eye subscore is 4. GCS verbal subscore is 5. GCS motor subscore is 6.  Skin: Skin is warm, dry and intact. He is not diaphoretic.  Psychiatric: He has a normal mood and affect. His speech is normal and behavior is normal.     Assessment/Plan Fall from roof Concussion Frontal skull fx -- No treatment necessary per Dr. Kathyrn Sheriff Right orbit fx -- ENT to consult Right radial head fx -- per Dr. Erlinda Hong T1 fx -- No treatment necessary per Dr. Kathyrn Sheriff Multiple pelvic fxs -- per Dr. Erlinda Hong  Admit to trauma. Orthopedic surgery, neurosurgery, and ENT to consult    [name], PA-C Pager: [phone] General Trauma PA Pager: [phone]  02/07/2016, 1:58 PM

## 2016-02-07 NOTE — ED Notes (Signed)
Pt c/o pain to his pelvic region, the R side of his head and his R arm ER MD made aware

## 2016-02-07 NOTE — Progress Notes (Signed)
Orthopedic Tech Progress Note Patient Details:  William Holt [birth year] [id number]  Patient ID: William Holt, male   DOB: 03-27-1985, 30 y.o.   MRN: [id number]   William Holt 02/07/2016, 11:18 AM Made level 2 trauma visit

## 2016-02-07 NOTE — ED Notes (Signed)
Spoke with ortho tech regarding long arm splint

## 2016-02-07 NOTE — ED Notes (Signed)
Pt c/o increasing pain to L elbow and L wrist admit MD paged family remains at bedside

## 2016-02-07 NOTE — ED Notes (Signed)
Pt back from xray family at bedside  

## 2016-02-07 NOTE — ED Notes (Signed)
Pt c/o gastric reflux pain orders released

## 2016-02-07 NOTE — Progress Notes (Signed)
Responded to level 2 page to support patient. Patient reported to ED after fallen from two story house. Per EMS patient hit head on concrete went unconscienceous. Patient is currently stable, alert and talk,ing to staff. No family notified EMS said friend from work was in route to ED.  Will follow as needed.   02/07/16 1100  Clinical Encounter Type  Visited With Patient;Health care provider  Visit Type Initial;Spiritual support;ED;Trauma  Referral From Nurse  Spiritual Encounters  Spiritual Needs Emotional  Stress Factors  Patient Stress Factors None identified  Jaclynn Major, [place] [phone]

## 2016-02-07 NOTE — ED Notes (Signed)
Pt's family brought to room ER MD made aware that pt is repeating his questions, though is orient x4 when asked

## 2016-02-07 NOTE — ED Notes (Signed)
Trauma NP at bedside pt still in x-ray

## 2016-02-07 NOTE — ED Notes (Signed)
Dr Brantley Stage paged at this time.

## 2016-02-07 NOTE — ED Notes (Signed)
Patient transported to CT 

## 2016-02-07 NOTE — ED Provider Notes (Signed)
[facility] DEPT Provider Note   CSN: [id number] Arrival date & time: 02/07/16  1107     History   Chief Complaint Chief Complaint  Patient presents with  . Fall    Level 2 trauma fall from the second story     HPI William Holt is a 30 y.o. male.   Fall  This is a new problem. The current episode started less than 1 hour ago. The problem occurs constantly. The problem has not changed since onset.Associated symptoms include chest pain, abdominal pain and headaches. Nothing aggravates the symptoms. Nothing relieves the symptoms. He has tried nothing for the symptoms.    History reviewed. No pertinent past medical history.  There are no active problems to display for this patient.   History reviewed. No pertinent surgical history.     Home Medications    Prior to Admission medications   Not on File    Family History No family history on file.  Social History Social History  Substance Use Topics  . Smoking status: Never Smoker  . Smokeless tobacco: Never Used  . Alcohol use No     Allergies   Patient has no known allergies.   Review of Systems Review of Systems  Cardiovascular: Positive for chest pain.  Gastrointestinal: Positive for abdominal pain.  Skin: Positive for wound.  Neurological: Positive for headaches.  All other systems reviewed and are negative.    Physical Exam Updated Vital Signs BP 112/78   Pulse 80   Temp 97.9 F (36.6 C)   Resp 18   Ht 6' (1.829 m)   Wt 200 lb (90.7 kg)   SpO2 95%   BMI 27.12 kg/m   Physical Exam  Constitutional: He is oriented to person, place, and time. He appears well-developed and well-nourished.  HENT:  Head: Normocephalic.  Right Ear: External ear normal.  Left Ear: External ear normal.  Eyes: Conjunctivae and EOM are normal. Pupils are equal, round, and reactive to light.  Neck: Normal range of motion.  Pulmonary/Chest: Effort normal and breath sounds normal.  Abdominal: Soft. He exhibits  no distension. There is no tenderness.  Musculoskeletal: Normal range of motion. He exhibits tenderness (right hip, right proximal forearm). He exhibits no edema or deformity.  Neurological: He is alert and oriented to person, place, and time. He displays normal reflexes. No cranial nerve deficit. Coordination normal.  Skin: Skin is warm and dry.  Multiple abrasions to face, no lacerations  Nursing note and vitals reviewed.    ED Treatments / Results  Labs (all labs ordered are listed, but only abnormal results are displayed) Labs Reviewed - No data to display  EKG  EKG Interpretation None       Radiology No results found.  Procedures Procedures (including critical care time) CRITICAL CARE Performed by: Merrily Pew Total critical care time: 35 minutes Critical care time was exclusive of separately billable procedures and treating other patients. Critical care was necessary to treat or prevent imminent or life-threatening deterioration. Critical care was time spent personally by me on the following activities: development of treatment plan with patient and/or surrogate as well as nursing, discussions with consultants, evaluation of patient's response to treatment, examination of patient, obtaining history from patient or surrogate, ordering and performing treatments and interventions, ordering and review of laboratory studies, ordering and review of radiographic studies, pulse oximetry and re-evaluation of patient's condition.   Medications Ordered in ED Medications - No data to display   Initial Impression / Assessment and  Plan / ED Course  I have reviewed the triage vital signs and the nursing notes.  Pertinent labs & imaging results that were available during my care of the patient were reviewed by me and considered in my medical decision making (see chart for details).  Clinical Course    Level II trauma activation for a Fall from approximately 20 feet with loss of  consciousness and repetitive questioning afterwards.  Imaging reveals multiple pelvic fractures multiple skull fractures and facial fractures and also a radial head fracture. Plan on Consultation neurosurgery, trauma surgery, orthopedic surgery Xu from ortho for pelvis and radial fractures, wants dedicated R elbow imaging.  D/w [place] Silos from Trauma who will admit. Nundkumar from [place] reviewed scans and nothing to do about T1 since not causing pain, nothing to do about facial fractures. Will d/w ENT about orbital roof and frontal bone. ENT never returned phone call prior to admission  Final Clinical Impressions(s) / ED Diagnoses   Final diagnoses:  Fall  Closed displaced fracture of head of right radius, initial encounter  Closed fracture of nasal bone, initial encounter  Closed displaced fracture of pelvis, unspecified part of pelvis, initial encounter ([place])  Concussion with loss of consciousness of 30 minutes or less, initial encounter    New Prescriptions New Prescriptions   No medications on file     Merrily Pew, MD 02/08/16 [phone]

## 2016-02-08 DIAGNOSIS — S3282XA Multiple fractures of pelvis without disruption of pelvic ring, initial encounter for closed fracture: Secondary | ICD-10-CM | POA: Diagnosis present

## 2016-02-08 DIAGNOSIS — W132XXA Fall from, out of or through roof, initial encounter: Secondary | ICD-10-CM | POA: Diagnosis present

## 2016-02-08 DIAGNOSIS — S060XAA Concussion with loss of consciousness status unknown, initial encounter: Secondary | ICD-10-CM | POA: Diagnosis present

## 2016-02-08 DIAGNOSIS — R339 Retention of urine, unspecified: Secondary | ICD-10-CM | POA: Diagnosis not present

## 2016-02-08 DIAGNOSIS — IMO0002 Reserved for concepts with insufficient information to code with codable children: Secondary | ICD-10-CM

## 2016-02-08 DIAGNOSIS — S22019A Unspecified fracture of first thoracic vertebra, initial encounter for closed fracture: Secondary | ICD-10-CM | POA: Diagnosis present

## 2016-02-08 DIAGNOSIS — S060X9A Concussion with loss of consciousness of unspecified duration, initial encounter: Secondary | ICD-10-CM | POA: Diagnosis present

## 2016-02-08 DIAGNOSIS — S52121A Displaced fracture of head of right radius, initial encounter for closed fracture: Secondary | ICD-10-CM | POA: Diagnosis present

## 2016-02-08 DIAGNOSIS — S329XXA Fracture of unspecified parts of lumbosacral spine and pelvis, initial encounter for closed fracture: Secondary | ICD-10-CM

## 2016-02-08 DIAGNOSIS — S0291XA Unspecified fracture of skull, initial encounter for closed fracture: Secondary | ICD-10-CM | POA: Diagnosis present

## 2016-02-08 DIAGNOSIS — S62102A Fracture of unspecified carpal bone, left wrist, initial encounter for closed fracture: Secondary | ICD-10-CM | POA: Diagnosis present

## 2016-02-08 DIAGNOSIS — S0285XA Fracture of orbit, unspecified, initial encounter for closed fracture: Secondary | ICD-10-CM | POA: Diagnosis present

## 2016-02-08 LAB — CBC
HCT: 37.8 % — ABNORMAL LOW (ref 39.0–52.0)
Hemoglobin: 13.2 g/dL (ref 13.0–17.0)
MCH: 29 pg (ref 26.0–34.0)
MCHC: 34.9 g/dL (ref 30.0–36.0)
MCV: 83.1 fL (ref 78.0–100.0)
PLATELETS: 118 10*3/uL — AB (ref 150–400)
RBC: 4.55 MIL/uL (ref 4.22–5.81)
RDW: 12.2 % (ref 11.5–15.5)
WBC: 8.7 10*3/uL (ref 4.0–10.5)

## 2016-02-08 LAB — BASIC METABOLIC PANEL
Anion gap: 9 (ref 5–15)
BUN: 9 mg/dL (ref 6–20)
CHLORIDE: 102 mmol/L (ref 101–111)
CO2: 24 mmol/L (ref 22–32)
CREATININE: 0.96 mg/dL (ref 0.61–1.24)
Calcium: 8.4 mg/dL — ABNORMAL LOW (ref 8.9–10.3)
GFR calc Af Amer: >60 mL/min (ref >60)
GLUCOSE: 120 mg/dL — AB (ref 65–99)
Potassium: 3.6 mmol/L (ref 3.5–5.1)
SODIUM: 135 mmol/L (ref 135–145)

## 2016-02-08 LAB — MRSA PCR SCREENING: MRSA BY PCR: NEGATIVE

## 2016-02-08 MED ORDER — POLYETHYLENE GLYCOL 3350 17 G PO PACK
17.0000 g | PACK | Freq: Two times a day (BID) | ORAL | Status: DC
Start: 2016-02-08 — End: 2016-02-10
  Administered 2016-02-08 – 2016-02-10 (×5): 17 g via ORAL
  Filled 2016-02-08 (×5): qty 1

## 2016-02-08 MED ORDER — BETHANECHOL CHLORIDE 25 MG PO TABS
25.0000 mg | ORAL_TABLET | Freq: Four times a day (QID) | ORAL | Status: DC
  Administered 2016-02-08 – 2016-02-10 (×9): 25 mg via ORAL
  Filled 2016-02-08 (×9): qty 1

## 2016-02-08 MED ORDER — HYDROMORPHONE HCL 2 MG/ML IJ SOLN
0.5000 mg | INTRAMUSCULAR | Status: DC | PRN
  Administered 2016-02-08 – 2016-02-09 (×2): 0.5 mg via INTRAVENOUS
  Filled 2016-02-08 (×2): qty 1

## 2016-02-08 MED ORDER — ENOXAPARIN SODIUM 40 MG/0.4ML ~~LOC~~ SOLN
40.0000 mg | SUBCUTANEOUS | Status: DC
  Administered 2016-02-08 – 2016-02-09 (×2): 40 mg via SUBCUTANEOUS
  Filled 2016-02-08 (×2): qty 0.4

## 2016-02-08 MED ORDER — ORAL CARE MOUTH RINSE
15.0000 mL | Freq: Two times a day (BID) | OROMUCOSAL | Status: DC
  Administered 2016-02-08 – 2016-02-10 (×4): 15 mL via OROMUCOSAL

## 2016-02-08 MED ORDER — DOCUSATE SODIUM 100 MG PO CAPS
200.0000 mg | ORAL_CAPSULE | Freq: Two times a day (BID) | ORAL | Status: DC
  Administered 2016-02-08 – 2016-02-10 (×5): 200 mg via ORAL
  Filled 2016-02-08 (×6): qty 2

## 2016-02-08 MED ORDER — TAMSULOSIN HCL 0.4 MG PO CAPS
0.4000 mg | ORAL_CAPSULE | Freq: Every day | ORAL | Status: DC
  Administered 2016-02-08 – 2016-02-10 (×3): 0.4 mg via ORAL
  Filled 2016-02-08 (×3): qty 1

## 2016-02-08 MED ORDER — HYDROMORPHONE HCL 1 MG/ML IJ SOLN
0.5000 mg | INTRAMUSCULAR | Status: DC | PRN
  Administered 2016-02-08 (×3): 0.5 mg via INTRAVENOUS
  Filled 2016-02-08 (×3): qty 1

## 2016-02-08 MED ORDER — INFLUENZA VAC SPLIT QUAD 0.5 ML IM SUSY
0.5000 mL | PREFILLED_SYRINGE | INTRAMUSCULAR | Status: AC
  Administered 2016-02-10: 0.5 mL via INTRAMUSCULAR
  Filled 2016-02-08 (×2): qty 0.5

## 2016-02-08 NOTE — Progress Notes (Signed)
Patient c/o abd pain. Has not voided in some time. Bladder scan revealed greater than 800 cc. Trauma paged, Legrand Como PA returned page. New order given. 1125 cc of urine retrieved post in/out cath.

## 2016-02-08 NOTE — Consult Note (Signed)
ORTHOPAEDIC CONSULTATION  REQUESTING PHYSICIAN: Trauma Md, MD  Chief Complaint: multiple ortho fx  HPI: William Holt is a 30 y.o. male who presents with right radial head fx, nondisplaced distal radius fx, pelvic ring fx and acetabular fractures s/p fall from ladder from 2nd story.  Positive LOC.  C/o headache, right elbow and pelvic pain.  Pain doesn't radiate, worse with movement, it's constant, throbs.    PMHx negative  Social History   Social History  . Marital status: Married    Spouse name: N/A  . Number of children: N/A  . Years of education: N/A   Social History Main Topics  . Smoking status: Never Smoker  . Smokeless tobacco: Never Used  . Alcohol use No  . Drug use: Unknown  . Sexual activity: Not Asked   Other Topics Concern  . None   Social History Narrative  . None   Neg fam hx - negative except otherwise stated in the family history section No Known Allergies Prior to Admission medications   Medication Sig Start Date End Date Taking? Authorizing Provider  Multiple Vitamin (MULTIVITAMIN WITH MINERALS) TABS tablet Take 1 tablet by mouth daily.   Yes Historical Provider, MD  ranitidine (ZANTAC) 150 MG tablet Take 150 mg by mouth at bedtime.   Yes Historical Provider, MD   Dg Elbow Complete Left  Result Date: 02/07/2016 CLINICAL DATA:  [place] off 2 story roof today with pain and swelling EXAM: LEFT ELBOW - COMPLETE 3+ VIEW COMPARISON:  None. FINDINGS: There is no evidence of fracture, dislocation, or joint effusion. There is no evidence of arthropathy or other focal bone abnormality. Soft tissues are unremarkable. IMPRESSION: Negative. Electronically Signed   By: Donavan Foil M.D.   On: 02/07/2016 19:30   Dg Elbow Complete Right  Result Date: 02/07/2016 CLINICAL DATA:  Right elbow pain after fall off roof. EXAM: RIGHT ELBOW - COMPLETE 3+ VIEW COMPARISON:  None. FINDINGS: Minimally displaced fracture is seen involving the radial head with intra-articular  extension. The joint has been splinted. No soft tissue abnormality is noted. IMPRESSION: Minimally displaced radial head fracture with intra-articular extension. Electronically Signed   By: Marijo Conception, M.D.   On: 02/07/2016 14:06   Dg Forearm Right  Result Date: 02/07/2016 CLINICAL DATA:  Fall. EXAM: RIGHT FOREARM - 2 VIEW COMPARISON:  No prior. FINDINGS: Angulated displaced fracture of the radial head is noted. Fracture extends into the radiocarpal joint space. No other focal abnormality identified. IMPRESSION: Angulated displaced fracture of the radial head. Electronically Signed   By: Marcello Moores  Register   On: 02/07/2016 12:19   Dg Wrist Complete Left  Result Date: 02/07/2016 CLINICAL DATA:  Fall from roof with pain and swelling EXAM: LEFT WRIST - COMPLETE 3+ VIEW COMPARISON:  None. FINDINGS: There is possible distal radius intra-articular fracture, best seen on the lateral projection with dorsal soft tissue swelling. This is not well visualized on the other views obtained. Distal ulna appears intact. IMPRESSION: Possible nondisplaced intra-articular distal radius fracture. Electronically Signed   By: Donavan Foil M.D.   On: 02/07/2016 19:31   Ct Head Wo Contrast  Result Date: 02/07/2016 CLINICAL DATA:  Status post fall from [place] height of 2 stories today. Initial encounter. EXAM: CT HEAD WITHOUT CONTRAST CT CERVICAL SPINE WITHOUT CONTRAST TECHNIQUE: Multidetector CT imaging of the head and cervical spine was performed following the standard protocol without intravenous contrast. Multiplanar CT image reconstructions of the cervical spine were also generated. COMPARISON:  None. FINDINGS: CT  HEAD FINDINGS Brain: Appears normal without hemorrhage, infarct, mass lesion, mass effect, midline shift or abnormal extra-axial fluid collection. No hydrocephalus or pneumocephalus. Vascular: Appear normal. Skull: The patient has right nasal bone fractures with slight impaction. Nondisplaced fracture of the  right orbital roof is identified. The fracture extends into the right frontal bone without displacement. Sinuses/Orbits: Mild mucosal thickening left sphenoid sinus is identified. There is also scattered ethmoid air cell disease. Other: None. CT CERVICAL SPINE FINDINGS Alignment: Maintained. Skull base and vertebrae: Mild superior endplate compression fracture of T1 eccentric to the left is identified. Vertebral body height loss is mild at approximately 15%. No extension into the posterior elements is identified. No other fracture is identified. Soft tissues and spinal canal: New Disc levels: 1 central canal and foramina appear open. Minimal disc bulging in the midcervical spine is noted. Upper chest: Negative. Other: None. IMPRESSION: Fractures of the right nasal bone, right orbital roof and right frontal bone. Negative for acute intracranial abnormality. Mild superior endplate compression fracture of T1. No involvement of the posterior elements or central canal compromise is identified. Critical Value/emergent results were called by telephone at the time of interpretation on 02/07/2016 at 12:29 pm to Dr. Merrily Pew , who verbally acknowledged these results. Electronically Signed   By: Inge Rise M.D.   On: 02/07/2016 12:32   Ct Chest W Contrast  Result Date: 02/07/2016 CLINICAL DATA:  Pain following trauma/fall EXAM: CT CHEST, ABDOMEN, AND PELVIS WITH CONTRAST TECHNIQUE: Multidetector CT imaging of the chest, abdomen and pelvis was performed following the standard protocol during bolus administration of intravenous contrast. CONTRAST:  167mL ISOVUE-300 IOPAMIDOL (ISOVUE-300) INJECTION 61% COMPARISON:  None. FINDINGS: CT CHEST FINDINGS Cardiovascular: There is no demonstrable mediastinal hematoma. There is no thoracic aortic aneurysm or dissection. No aortic mucosal lesion or periaortic fluid evident. No evident contrast extravasation. The visualized great vessels appear unremarkable. The pericardium  is not appreciably thickened. Mediastinum/Nodes: Visualized thyroid appears unremarkable. There is no appreciable thoracic adenopathy. Lungs/Pleura: There is no lung edema or consolidation. No pneumothorax or contusion. There is slight bibasilar atelectasis. There is no pleural effusion or pleural thickening evident. Musculoskeletal: There is no fracture or dislocation evident in thoracic region. No blastic or lytic bone lesions. No chest wall lesions evident. CT ABDOMEN PELVIS FINDINGS Hepatobiliary: Liver appears intact without laceration or rupture. No perihepatic fluid. No focal liver lesions are evident. Gallbladder wall is not appreciably thickened. There is no biliary duct dilatation. Pancreas: Pancreas appears intact. No peripancreatic fluid. No pancreatic mass or inflammatory focus. Spleen: Spleen appears intact without laceration or rupture. No perisplenic fluid. No focal splenic lesions. Adrenals/Urinary Tract: Adrenals appear normal bilaterally. Kidneys bilaterally show no evidence of mass or hydronephrosis. There is no perinephric fluid or stranding. No contrast extravasation. No renal laceration or rupture. No renal or ureteral calculus is evident on either side. Urinary bladder is midline with wall thickness within normal limits. Stomach/Bowel: There is no appreciable bowel wall or mesenteric thickening. There is no bowel obstruction. No free air or portal venous air. Vascular/Lymphatic: Aorta appears intact. There is no periaortic fluid. No abdominal aortic aneurysm. Major mesenteric vessels appear patent. No vascular lesions are evident. There is no adenopathy in the abdomen or pelvis. Reproductive: Prostate and seminal vesicles appear normal in size and contour. There are a few tiny prostatic calculi. There is no pelvic mass or pelvic fluid collection. Other: The appendix appears normal. There is no ascites or abscess in the abdomen or pelvis. There are no  intraperitoneal or retroperitoneal fluid  collections in the abdomen or pelvis. Musculoskeletal: There is a comminuted fracture of the right sacral ala. Mild anterior displacement of a fracture fragment along the anterior sacral ala on the right is noted. There is a fracture of the superior left pubic symphysis in near anatomic alignment. There is a fracture of the each anterior acetabular region without fracture fragments extending into the respective hip joints. No other fractures are evident. No dislocations. There are several Schmorl's nodes in the upper lumbar region, an anatomic variant. There are no blastic or lytic bone lesions. There is no intramuscular or abdominal wall lesion. IMPRESSION: CT chest: No vascular lesion evident. No fracture or dislocation. Slight bibasilar atelectasis without parenchymal lung contusion or pneumothorax. No edema or consolidation. No adenopathy. CT abdomen and pelvis: Comminuted fracture right sacral ala. Fracture along the superior left pubic symphysis in near anatomic alignment. Fractures of each anterior acetabular region without fracture fragments extending into the respective hip joints. Visceral structures appear intact without laceration or rupture. No bowel wall thickening or bowel obstruction. No abscess. No abnormal fluid collections. Appendix appears normal. No renal or ureteral calculi. No hydronephrosis. There are a few tiny prostatic calculi. No vascular lesion evident. Electronically Signed   By: Lowella Grip III M.D.   On: 02/07/2016 12:24   Ct Cervical Spine Wo Contrast  Result Date: 02/07/2016 CLINICAL DATA:  Status post fall from [place] height of 2 stories today. Initial encounter. EXAM: CT HEAD WITHOUT CONTRAST CT CERVICAL SPINE WITHOUT CONTRAST TECHNIQUE: Multidetector CT imaging of the head and cervical spine was performed following the standard protocol without intravenous contrast. Multiplanar CT image reconstructions of the cervical spine were also generated. COMPARISON:  None. FINDINGS:  CT HEAD FINDINGS Brain: Appears normal without hemorrhage, infarct, mass lesion, mass effect, midline shift or abnormal extra-axial fluid collection. No hydrocephalus or pneumocephalus. Vascular: Appear normal. Skull: The patient has right nasal bone fractures with slight impaction. Nondisplaced fracture of the right orbital roof is identified. The fracture extends into the right frontal bone without displacement. Sinuses/Orbits: Mild mucosal thickening left sphenoid sinus is identified. There is also scattered ethmoid air cell disease. Other: None. CT CERVICAL SPINE FINDINGS Alignment: Maintained. Skull base and vertebrae: Mild superior endplate compression fracture of T1 eccentric to the left is identified. Vertebral body height loss is mild at approximately 15%. No extension into the posterior elements is identified. No other fracture is identified. Soft tissues and spinal canal: New Disc levels: 1 central canal and foramina appear open. Minimal disc bulging in the midcervical spine is noted. Upper chest: Negative. Other: None. IMPRESSION: Fractures of the right nasal bone, right orbital roof and right frontal bone. Negative for acute intracranial abnormality. Mild superior endplate compression fracture of T1. No involvement of the posterior elements or central canal compromise is identified. Critical Value/emergent results were called by telephone at the time of interpretation on 02/07/2016 at 12:29 pm to Dr. Merrily Pew , who verbally acknowledged these results. Electronically Signed   By: Inge Rise M.D.   On: 02/07/2016 12:32   Ct Abdomen Pelvis W Contrast  Result Date: 02/07/2016 CLINICAL DATA:  Pain following trauma/fall EXAM: CT CHEST, ABDOMEN, AND PELVIS WITH CONTRAST TECHNIQUE: Multidetector CT imaging of the chest, abdomen and pelvis was performed following the standard protocol during bolus administration of intravenous contrast. CONTRAST:  150mL ISOVUE-300 IOPAMIDOL (ISOVUE-300) INJECTION  61% COMPARISON:  None. FINDINGS: CT CHEST FINDINGS Cardiovascular: There is no demonstrable mediastinal hematoma. There is no thoracic  aortic aneurysm or dissection. No aortic mucosal lesion or periaortic fluid evident. No evident contrast extravasation. The visualized great vessels appear unremarkable. The pericardium is not appreciably thickened. Mediastinum/Nodes: Visualized thyroid appears unremarkable. There is no appreciable thoracic adenopathy. Lungs/Pleura: There is no lung edema or consolidation. No pneumothorax or contusion. There is slight bibasilar atelectasis. There is no pleural effusion or pleural thickening evident. Musculoskeletal: There is no fracture or dislocation evident in thoracic region. No blastic or lytic bone lesions. No chest wall lesions evident. CT ABDOMEN PELVIS FINDINGS Hepatobiliary: Liver appears intact without laceration or rupture. No perihepatic fluid. No focal liver lesions are evident. Gallbladder wall is not appreciably thickened. There is no biliary duct dilatation. Pancreas: Pancreas appears intact. No peripancreatic fluid. No pancreatic mass or inflammatory focus. Spleen: Spleen appears intact without laceration or rupture. No perisplenic fluid. No focal splenic lesions. Adrenals/Urinary Tract: Adrenals appear normal bilaterally. Kidneys bilaterally show no evidence of mass or hydronephrosis. There is no perinephric fluid or stranding. No contrast extravasation. No renal laceration or rupture. No renal or ureteral calculus is evident on either side. Urinary bladder is midline with wall thickness within normal limits. Stomach/Bowel: There is no appreciable bowel wall or mesenteric thickening. There is no bowel obstruction. No free air or portal venous air. Vascular/Lymphatic: Aorta appears intact. There is no periaortic fluid. No abdominal aortic aneurysm. Major mesenteric vessels appear patent. No vascular lesions are evident. There is no adenopathy in the abdomen or  pelvis. Reproductive: Prostate and seminal vesicles appear normal in size and contour. There are a few tiny prostatic calculi. There is no pelvic mass or pelvic fluid collection. Other: The appendix appears normal. There is no ascites or abscess in the abdomen or pelvis. There are no intraperitoneal or retroperitoneal fluid collections in the abdomen or pelvis. Musculoskeletal: There is a comminuted fracture of the right sacral ala. Mild anterior displacement of a fracture fragment along the anterior sacral ala on the right is noted. There is a fracture of the superior left pubic symphysis in near anatomic alignment. There is a fracture of the each anterior acetabular region without fracture fragments extending into the respective hip joints. No other fractures are evident. No dislocations. There are several Schmorl's nodes in the upper lumbar region, an anatomic variant. There are no blastic or lytic bone lesions. There is no intramuscular or abdominal wall lesion. IMPRESSION: CT chest: No vascular lesion evident. No fracture or dislocation. Slight bibasilar atelectasis without parenchymal lung contusion or pneumothorax. No edema or consolidation. No adenopathy. CT abdomen and pelvis: Comminuted fracture right sacral ala. Fracture along the superior left pubic symphysis in near anatomic alignment. Fractures of each anterior acetabular region without fracture fragments extending into the respective hip joints. Visceral structures appear intact without laceration or rupture. No bowel wall thickening or bowel obstruction. No abscess. No abnormal fluid collections. Appendix appears normal. No renal or ureteral calculi. No hydronephrosis. There are a few tiny prostatic calculi. No vascular lesion evident. Electronically Signed   By: Lowella Grip III M.D.   On: 02/07/2016 12:24   Dg Pelvis Portable  Result Date: 02/07/2016 CLINICAL DATA:  Fall from roof.  Extremity pain.  Initial encounter. EXAM: PORTABLE  PELVIS 1-2 VIEWS COMPARISON:  None. FINDINGS: No diastasis or displaced fracture. On the second of two views of the pelvis there is a left superior pubic body fracture which favors avulsion injury. Both hips are located. IMPRESSION: Probable avulsion fracture from the superior left pubic body. Electronically Signed   By:  Monte Fantasia M.D.   On: 02/07/2016 11:55   Dg Chest Portable 1 View  Result Date: 02/07/2016 CLINICAL DATA:  [place] off 2 story roof. Right chest and arm pain. Initial encounter. EXAM: PORTABLE CHEST 1 VIEW COMPARISON:  None. FINDINGS: The heart size and mediastinal contours are within normal limits. Both lungs are clear. No evidence of pneumothorax or hemothorax. The visualized skeletal structures are unremarkable. IMPRESSION: No active disease. Electronically Signed   By: Earle Gell M.D.   On: 02/07/2016 11:54   Dg Humerus Right  Result Date: 02/07/2016 CLINICAL DATA:  Fall. EXAM: RIGHT HUMERUS - 2+ VIEW COMPARISON:  No prior. FINDINGS: No acute bony or joint abnormality identified. No evidence of fracture or dislocation. IMPRESSION: No acute or focal abnormality. Electronically Signed   By: Marcello Moores  Register   On: 02/07/2016 12:17   - pertinent xrays, CT, MRI studies were reviewed and independently interpreted  Positive ROS: All other systems have been reviewed and were otherwise negative with the exception of those mentioned in the HPI and as above.  Physical Exam: General: Alert, no acute distress Cardiovascular: No pedal edema Respiratory: No cyanosis, no use of accessory musculature GI: No organomegaly, abdomen is soft and non-tender Skin: No lesions in the area of chief complaint Neurologic: Sensation intact distally Psychiatric: Patient is competent for consent with normal mood and affect Lymphatic: No axillary or cervical lymphadenopathy  MUSCULOSKELETAL:  LUE - ttp over dorsal wrist, nvi, hand wwp RUE - ttp radial head, nvi, hand wwp BLE - nvi, pelvis  ttp  Assessment: 1. Right radial head fx 2. Left wrist fx ? 3. Pelvic ring fxs 4. Acetabular fxs  Plan: - will plan to treat all fx's nonop - LUE WBAT in removable wrist brace - RUE platform weight bear, d/c splint, just sling only, begin ROM of elbow in 10-14 days - BLE WBAT - up with PT/OT - f/u 2 weeks  Thank you for the consult and the opportunity to see William Holt. Eduard Roux, MD [facility] [place] 9:09 AM

## 2016-02-08 NOTE — Evaluation (Signed)
Physical Therapy Evaluation Patient Details Name: William Holt MRN: [id number] DOB: 1985-08-17 Today's Date: 02/08/2016   History of Present Illness  pt presents after falling from ladder at 2nd floor height.  pt sustained R Orbital fx, R Frontal fx, R Nasal fx, Bil Radial fxs, T1 fx, R Sacral fx, Bil Acetabular fxs, and L Pubic fx.  No PMH.    Clinical Impression  Pt very painful with all movement.  Pt able to come to standing, however very painful and very increased time needed to complete mobility.  Pt may need CIR level of therapies to increase independence and decrease burden of care prior to D/C to home pending progress while on acute.  Will continue to follow and update D/C plan as needed.      Follow Up Recommendations CIR (pending progress)    Equipment Recommendations  Rolling walker with 5" wheels;3in1 (PT) (R platform RW)    Recommendations for Other Services Rehab consult     Precautions / Restrictions Precautions Precautions: Fall;Back Precaution Booklet Issued: No Required Braces or Orthoses: Sling;Other Brace/Splint Other Brace/Splint: L wrist splint Restrictions Weight Bearing Restrictions: Yes RUE Weight Bearing: Weight bear through elbow only LUE Weight Bearing: Weight bearing as tolerated RLE Weight Bearing: Weight bearing as tolerated LLE Weight Bearing: Weight bearing as tolerated      Mobility  Bed Mobility Overal bed mobility: Needs Assistance Bed Mobility: Rolling;Sidelying to Sit;Sit to Supine Rolling: Mod assist Sidelying to sit: Mod assist;HOB elevated   Sit to supine: Mod assist;+2 for physical assistance   General bed mobility comments: Attempted to have pt log roll for coming to sitting, however pt indicates too painful and prefers to come to sit after partially rolling towards L side.  2nd person present to A with returning to supine.    Transfers Overall transfer level: Needs assistance Equipment used: 1 person hand held assist Transfers:  Sit to/from Stand Sit to Stand: Mod assist         General transfer comment: cues for use of L UE and controlling descent to sitting.    Ambulation/Gait                Stairs            Wheelchair Mobility    Modified Rankin (Stroke Patients Only)       Balance Overall balance assessment: Needs assistance Sitting-balance support: Single extremity supported;Feet supported Sitting balance-Leahy Scale: Poor     Standing balance support: No upper extremity supported;During functional activity Standing balance-Leahy Scale: Poor                               Pertinent Vitals/Pain Pain Assessment: 0-10 Pain Score: 8  Pain Location: Pelvis worse than other fxs. Pain Descriptors / Indicators: Aching;Grimacing;Guarding;Moaning Pain Intervention(s): Monitored during session;Premedicated before session;Repositioned;RN gave pain meds during session (IV meds at end of session)    Home Living Family/patient expects to be discharged to:: Private residence Living Arrangements: Spouse/significant other;Children Available Help at Discharge: Family;Available PRN/intermittently Type of Home: House Home Access: Stairs to enter Entrance Stairs-Rails: Right;Left Entrance Stairs-Number of Steps: 3 Home Layout: Two level;Able to live on main level with bedroom/bathroom Home Equipment: None (Wife to check if family have shower seat)      Prior Function Level of Independence: Independent               Hand Dominance        Extremity/Trunk  Assessment   Upper Extremity Assessment Upper Extremity Assessment: Defer to OT evaluation    Lower Extremity Assessment Lower Extremity Assessment: RLE deficits/detail;LLE deficits/detail RLE Deficits / Details: Sensation intact, Strength and ROM limited by pain in pelvis.  Strength grossly >3/5, but painful.   RLE: Unable to fully assess due to pain RLE Coordination: decreased fine motor;decreased gross  motor LLE Deficits / Details: Sensation intact, Strength and ROM limited by pain in pelvis.  Strength grossly >3/5, but painful.   LLE: Unable to fully assess due to pain LLE Coordination: decreased fine motor;decreased gross motor    Cervical / Trunk Assessment Cervical / Trunk Assessment: Normal  Communication   Communication: No difficulties  Cognition Arousal/Alertness: Awake/alert Behavior During Therapy: WFL for tasks assessed/performed Overall Cognitive Status: Within Functional Limits for tasks assessed                      General Comments      Exercises     Assessment/Plan    PT Assessment Patient needs continued PT services  PT Problem List Decreased strength;Decreased activity tolerance;Decreased mobility;Decreased balance;Decreased coordination;Decreased knowledge of use of DME;Decreased knowledge of precautions;Cardiopulmonary status limiting activity;Pain          PT Treatment Interventions DME instruction;Gait training;Stair training;Functional mobility training;Therapeutic activities;Therapeutic exercise;Balance training;Neuromuscular re-education;Patient/family education    PT Goals (Current goals can be found in the Care Plan section)  Acute Rehab PT Goals Patient Stated Goal: Home ASAP PT Goal Formulation: With patient Time For Goal Achievement: 02/22/16 Potential to Achieve Goals: Good    Frequency Min 5X/week   Barriers to discharge        Co-evaluation               End of Session Equipment Utilized During Treatment: Gait belt Activity Tolerance: Patient limited by pain Patient left: in bed;with call bell/phone within reach;with family/visitor present;with nursing/sitter in room Nurse Communication: Mobility status         Time: [id number] PT Time Calculation (min) (ACUTE ONLY): 46 min   Charges:   PT Evaluation $PT Eval Moderate Complexity: 1 Procedure PT Treatments $Therapeutic Activity: 23-37 mins   PT G CodesCatarina Hartshorn, PT  [phone] 02/08/2016, 3:21 PM

## 2016-02-08 NOTE — ED Notes (Signed)
Ortho paged. 

## 2016-02-08 NOTE — Progress Notes (Signed)
Patient bladder scanned prior to transferring to new room. Scan shows greater than 800 cc. In and Out craterization performed and [phone] retrieved. Will pass this information to new RN.

## 2016-02-08 NOTE — Progress Notes (Signed)
Rehab Admissions Coordinator Note:  Patient was screened by Retta Diones for appropriateness for an Inpatient Acute Rehab Consult.  At this time, we are recommending Inpatient Rehab consult.  Jodell Cipro M 02/08/2016, 4:08 PM  I can be reached at [phone].

## 2016-02-08 NOTE — Progress Notes (Signed)
Patient transferred to 5N02 on oxygen. SCDs on. Family at bedside.

## 2016-02-08 NOTE — Progress Notes (Signed)
Orthopedic Tech Progress Note Patient Details:  William Holt Apr 14, 1985 [id number]  Ortho Devices Type of Ortho Device: Arm sling, Long arm splint Ortho Device/Splint Location: rue Ortho Device/Splint Interventions: Application, Removal   [name] 02/08/2016, 9:43 AM Removal of splint to [place]; application of sling to [employer]

## 2016-02-08 NOTE — Evaluation (Signed)
Speech Language Pathology Evaluation Patient Details Name: William Holt MRN: [id number] DOB: [birth year] Today's Date: 02/08/2016 Time: [id number] SLP Time Calculation (min) (ACUTE ONLY): 18 min  Problem List:  Patient Active Problem List   Diagnosis Date Noted  . Closed displaced fracture of head of right radius   . TBI (traumatic brain injury) ([place]) 02/07/2016   Past Medical History: History reviewed. No pertinent past medical history. Past Surgical History: History reviewed. No pertinent surgical history. HPI:  William Holt is a 31 y.o. male who presents with right radial head fx, nondisplaced distal radius fx, pelvic ring fx and acetabular fractures s/p fall from ladder from 2nd story.  Positive LOC.   Assessment / Plan / Recommendation Clinical Impression  Patient presents with normal cognitive-linguistic function. No further SLP needs indicated at this time. Educated patient and mother regarding potential impact of LOC on cognition.     SLP Assessment  Patient does not need any further Speech Lanaguage Pathology Services    Follow Up Recommendations  None          SLP Evaluation Cognition  Overall Cognitive Status: Within Functional Limits for tasks assessed Orientation Level: Oriented X4       Comprehension  Auditory Comprehension Overall Auditory Comprehension: Appears within functional limits for tasks assessed Visual Recognition/Discrimination Discrimination: Within Function Limits Reading Comprehension Reading Status: Within funtional limits    Expression Expression Primary Mode of Expression: Verbal   Oral / Motor  Oral Motor/Sensory Function Overall Oral Motor/Sensory Function: Within functional limits Motor Speech Overall Motor Speech: Appears within functional limits for tasks assessed   GO            Gabriel Rainwater MA, CCC-SLP [phone]         Vernadette Stutsman Meryl 02/08/2016, 9:37 AM

## 2016-02-08 NOTE — Progress Notes (Signed)
Patient ID: William Holt, male   DOB: 30-Jun-1985, 30 y.o.   MRN: [id number]   LOS: 1 day   Subjective: Having right arm pain. Had issue with urinary retention earlier, needed I&O cath for >1L.   Objective: Vital signs in last 24 hours: Temp:  [98.5 F (36.9 C)] 98.5 F (36.9 C) (12/20 0824) Pulse Rate:  [63-97] 96 (12/20 1100) Resp:  [10-22] 17 (12/20 1100) BP: (86-127)/(55-84) 127/83 (12/20 1100) SpO2:  [83 %-100 %] 97 % (12/20 1100) Last BM Date: 02/07/16   Laboratory  CBC  Recent Labs  02/07/16 1115 02/08/16 0607  WBC 6.3 8.7  HGB 15.6 13.2  HCT 43.2 37.8*  PLT 164 118*   BMET  Recent Labs  02/07/16 1115 02/08/16 0607  NA 139 135  K 3.2* 3.6  CL 103 102  CO2 25 24  GLUCOSE 125* 120*  BUN 10 9  CREATININE 1.01 0.96  CALCIUM 9.2 8.4*    Physical Exam General appearance: alert and no distress Resp: clear to auscultation bilaterally Cardio: regular rate and rhythm GI: normal findings: bowel sounds normal and soft, non-tender Extremities: Sensation intact Pulses: 2+ and symmetric   Assessment/Plan: Fall from roof Concussion Frontal skull fx -- Non-op per Dr. Kathyrn Sheriff Right orbit fx -- Non-op per Dr. Redmond Baseman Right radial head fx -- Platform WB per Dr. Erlinda Hong Left wrist fx -- WBAT in splint per Dr. Erlinda Hong T1 fx -- No treatment necessary per Dr. Kathyrn Sheriff Multiple pelvic fxs -- WBAT per Dr. Erlinda Hong Urinary retention -- Add urecholine and Flomax FEN -- SL IV VTE -- SCD's, Lovenox Dispo -- Transfer to floor, PT/OT    [name], PA-C Pager: [phone] General Trauma PA Pager: [phone]  02/08/2016

## 2016-02-08 NOTE — Progress Notes (Signed)
PT Cancellation Note  Patient Details Name: William Holt MRN: [id number] DOB: [birth year]   Cancelled Treatment:    Reason Eval/Treat Not Completed: Patient not medically ready.  Noted pt with multiple pelvic fxs including sacral, pubic rami, and Bil acetabular fxs per Trauma note.  Awaiting Ortho consult prior to initiating PT and mobility.  Will f/u as appropriate.     Thornton Papas Ashli Selders 02/08/2016, 8:18 AM

## 2016-02-08 NOTE — ED Notes (Signed)
Ortho at bedside to place splint.

## 2016-02-09 ENCOUNTER — Encounter (HOSPITAL_COMMUNITY): Payer: Self-pay | Admitting: Physical Medicine and Rehabilitation

## 2016-02-09 DIAGNOSIS — S62102A Fracture of unspecified carpal bone, left wrist, initial encounter for closed fracture: Secondary | ICD-10-CM

## 2016-02-09 DIAGNOSIS — S069X3A Unspecified intracranial injury with loss of consciousness of 1 hour to 5 hours 59 minutes, initial encounter: Secondary | ICD-10-CM

## 2016-02-09 DIAGNOSIS — R339 Retention of urine, unspecified: Secondary | ICD-10-CM

## 2016-02-09 DIAGNOSIS — S52121A Displaced fracture of head of right radius, initial encounter for closed fracture: Secondary | ICD-10-CM

## 2016-02-09 DIAGNOSIS — S22019A Unspecified fracture of first thoracic vertebra, initial encounter for closed fracture: Secondary | ICD-10-CM

## 2016-02-09 DIAGNOSIS — S329XXA Fracture of unspecified parts of lumbosacral spine and pelvis, initial encounter for closed fracture: Secondary | ICD-10-CM

## 2016-02-09 MED ORDER — HYDROXYZINE HCL 25 MG PO TABS
25.0000 mg | ORAL_TABLET | Freq: Three times a day (TID) | ORAL | Status: DC | PRN
  Administered 2016-02-09: 25 mg via ORAL
  Filled 2016-02-09: qty 1

## 2016-02-09 MED ORDER — PROMETHAZINE HCL 25 MG/ML IJ SOLN
12.5000 mg | INTRAMUSCULAR | Status: DC | PRN
  Administered 2016-02-09 – 2016-02-10 (×3): 12.5 mg via INTRAVENOUS
  Filled 2016-02-09 (×3): qty 1

## 2016-02-09 MED ORDER — DIPHENHYDRAMINE HCL 25 MG PO CAPS
25.0000 mg | ORAL_CAPSULE | Freq: Four times a day (QID) | ORAL | Status: DC | PRN
  Administered 2016-02-09 (×2): 25 mg via ORAL
  Filled 2016-02-09 (×2): qty 1

## 2016-02-09 MED ORDER — BACITRACIN ZINC 500 UNIT/GM EX OINT
TOPICAL_OINTMENT | Freq: Two times a day (BID) | CUTANEOUS | Status: DC
  Administered 2016-02-09 – 2016-02-10 (×3): via TOPICAL
  Filled 2016-02-09: qty 28.35

## 2016-02-09 MED ORDER — WHITE PETROLATUM GEL
Status: AC
  Administered 2016-02-09: 07:00:00
  Filled 2016-02-09: qty 1

## 2016-02-09 MED ORDER — TRAMADOL HCL 50 MG PO TABS
100.0000 mg | ORAL_TABLET | Freq: Four times a day (QID) | ORAL | Status: DC
  Administered 2016-02-09 – 2016-02-10 (×5): 100 mg via ORAL
  Filled 2016-02-09 (×5): qty 2

## 2016-02-09 NOTE — Care Management Note (Signed)
Case Management Note  Patient Details  Name: Egidio Shellhouse MRN: [id number] Date of Birth: 09/13/1985  Subjective/Objective:   Pt admitted on 02/07/16 s/p fall from ladder with LOC, Rt orbital fx, Rt frontal fx, Rt nasal fx, bilateral radial fx, T1 fx, sacral fx, bilateral acetabular fx, and Lt pubic fx.  PTA, pt independent, lives at home with wife and children.                   Action/Plan:  PT recommending HH follow up, RW, and 3 in 1.  Will follow for [facility] and DME orders as pt progresses.  Family to provide assistance at dc.    Expected Discharge Date:                  Expected Discharge Plan:  [place]  In-House Referral:     Discharge planning Services  CM Consult  Post Acute Care Choice:    Choice offered to:     DME Arranged:    DME Agency:     HH Arranged:    [place] Agency:     Status of Service:  In process, will continue to follow  If discussed at Long Length of Stay Meetings, dates discussed:    Additional Comments:  Reinaldo Raddle, RN, BSN  Trauma/Neuro ICU Case Manager [phone]

## 2016-02-09 NOTE — Progress Notes (Signed)
Physical Therapy Treatment Patient Details Name: William Holt MRN: [id number] DOB: Apr 18, 1985 Today's Date: 02/09/2016    History of Present Illness pt presents after falling from ladder at 2nd floor height.  pt sustained R Orbital fx, R Frontal fx, R Nasal fx, Bil Radial fxs, T1 fx, R Sacral fx, Bil Acetabular fxs, and L Pubic fx.  No PMH.      PT Comments    Pt progressing well with mobility. Min A to ambulate 5' with HHA, distance limited by grogginess (likely due to pain meds).   Follow Up Recommendations  HHPT     Equipment Recommendations  Rolling walker with 5" wheels;3in1 (PT) (R platform RW)    Recommendations for Other Services Rehab consult     Precautions / Restrictions Precautions Precautions: Fall;Back Precaution Booklet Issued: No Required Braces or Orthoses: Sling;Other Brace/Splint Other Brace/Splint: L wrist splint Restrictions Weight Bearing Restrictions: Yes RUE Weight Bearing: Weight bear through elbow only RUE Partial Weight Bearing Percentage or Pounds:  (none ordered-platform walker) LUE Weight Bearing: Weight bearing as tolerated RLE Weight Bearing: Weight bearing as tolerated LLE Weight Bearing: Weight bearing as tolerated    Mobility  Bed Mobility Overal bed mobility: Needs Assistance Bed Mobility: Supine to Sit     Supine to sit: HOB elevated;Min assist;Mod assist;+2 for physical assistance     General bed mobility comments: Mod A to raise trunk, HOB up 25*  Transfers Overall transfer level: Needs assistance Equipment used: 1 person hand held assist Transfers: Sit to/from Stand Sit to Stand: Min assist         General transfer comment: min A to rise, VCs hand placement  Ambulation/Gait Ambulation/Gait assistance: Min assist Ambulation Distance (Feet): 5 Feet Assistive device: 1 person hand held assist Gait Pattern/deviations: Step-to pattern;Decreased step length - right;Decreased step length - left;Decreased weight shift to  right   Gait velocity interpretation: Below normal speed for age/gender General Gait Details: min HHA LUE, will plan trial of PFRW next session (unable to find a PFRW at present)   Stairs            Wheelchair Mobility    Modified Rankin (Stroke Patients Only)       Balance Overall balance assessment: Needs assistance Sitting-balance support: Single extremity supported;Feet supported Sitting balance-Leahy Scale: Fair     Standing balance support: During functional activity;Single extremity supported Standing balance-Leahy Scale: Poor                      Cognition Arousal/Alertness: Awake/alert Behavior During Therapy: WFL for tasks assessed/performed Overall Cognitive Status: Within Functional Limits for tasks assessed                      Exercises      General Comments        Pertinent Vitals/Pain Pain Score: 8  Pain Location: R pelvis with weight bearing Pain Descriptors / Indicators: Aching;Sore Pain Intervention(s): Monitored during session;Premedicated before session;Limited activity within patient's tolerance    Home Living                      Prior Function            PT Goals (current goals can now be found in the care plan section) Acute Rehab PT Goals Patient Stated Goal: Home ASAP PT Goal Formulation: With patient/family Time For Goal Achievement: 02/22/16 Potential to Achieve Goals: Good Progress towards PT goals: Progressing toward goals  Frequency    Min 5X/week      PT Plan Current plan remains appropriate    Co-evaluation             End of Session Equipment Utilized During Treatment: Gait belt Activity Tolerance: Patient limited by pain Patient left: with call bell/phone within reach;with family/visitor present;in chair --    Time: [id number] PT Time Calculation (min) (ACUTE ONLY): 15 min  Charges:  $Therapeutic Activity: 8-22 mins                    G Codes:      Philomena Doheny 02/09/2016, 10:57 AM  [phone]

## 2016-02-09 NOTE — Consult Note (Signed)
Physical Medicine and Rehabilitation Consult   Reason for Consult: Polytrauma with TBI,  Referring Physician: Trauma   HPI: William Holt is a 30 y.o. male who fell off second story roof while at work on 02/07/16, struck first floor on the way down and had positive LOC and brief amnesia. 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 He was evaluated by Dr. Redmond Baseman who felt that no surgical intervention needed on orbital or nasal fractures. Dr. Erlinda Hong recommended WBAT on BLE,  WBAT LUE with brace and sling RUE with WBAT on elbow--limit ROM for 10-14 days. Patient has had issues with abdominal pain due to urinary retention--cath for 860 cc as well as nausea.  PT evaluations done and patient with limitations in mobility. CIR recommended for follow up therapy.   Patient states he did not remember ambulance ride, remembered waking up in the CT scanner. Discussed with physical therapy who just finished working with him. Basically at a min hand-held assist. No confusion or agitation noted. No safety issues. Patient still has Foley catheter in. Started on Urecholine   Review of Systems  HENT: Negative for hearing loss and tinnitus.   Eyes: Negative for blurred vision and double vision.  Respiratory: Negative for shortness of breath.   Cardiovascular: Negative for chest pain and palpitations.  Gastrointestinal: Positive for heartburn and nausea (frequent PTA).  Genitourinary: Negative for dysuria.  Musculoskeletal: Positive for back pain, joint pain and myalgias.  Skin: Negative for itching and rash.  Neurological: Negative for dizziness, sensory change and speech change.  Psychiatric/Behavioral: Positive for memory loss. The patient does  not have insomnia.      Past Medical History:  Diagnosis Date  . Dyspepsia   . GERD (gastroesophageal reflux disease)   . IBS (irritable bowel syndrome)       Past Surgical History:  Procedure Laterality Date  . HAND SURGERY     2002, 2006     Family History  Problem Relation Age of Onset  . Deep vein thrombosis Mother   . Prostate cancer Father   . Deep vein thrombosis Sister       Social History:   Married. 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  Wife works and they have a one year old. Multiple family members live in the neighborhood and can assist after discharge. He reports that he has never smoked. He has never used smokeless tobacco. He reports that he drink alcohol occasionally.  He denies any drug use.     Allergies: No Known Allergies    Medications Prior to Admission  Medication Sig Dispense Refill  . Multiple Vitamin (MULTIVITAMIN WITH MINERALS) TABS tablet Take 1 tablet by mouth daily.    . ranitidine (ZANTAC) 150 MG tablet Take 150 mg by mouth at bedtime.      Home: Home Living Family/patient expects to be discharged to:: Private residence Living Arrangements: Spouse/significant other, Children Available Help at Discharge: Family, Available PRN/intermittently Type of Home: House Home Access: Stairs to enter [employer] of Steps: 3 Entrance Stairs-Rails: Right, Left Home Layout: Two level, Able to live on main level with bedroom/bathroom Bathroom Shower/Tub: Multimedia programmer: Handicapped height Home Equipment: None (Wife to check if family have shower seat)  Lives With: Spouse,  Son  Functional History: Prior Function Level of Independence: Independent Functional Status:  Mobility: Bed Mobility Overal bed mobility: Needs Assistance Bed Mobility: Rolling, Sidelying to Sit, Sit to Supine Rolling: Mod assist Sidelying to sit: Mod assist, HOB elevated Sit to supine: Mod assist, +2 for physical  assistance General bed mobility comments: Attempted to have pt log roll for coming to sitting, however pt indicates too painful and prefers to come to sit after partially rolling towards L side.  2nd person present to A with returning to supine.   Transfers Overall transfer level: Needs assistance Equipment used: 1 person hand held assist Transfers: Sit to/from Stand Sit to Stand: Mod assist General transfer comment: cues for use of L UE and controlling descent to sitting.        ADL:    Cognition: Cognition Overall Cognitive Status: Within Functional Limits for tasks assessed Orientation Level: Oriented X4 Cognition Arousal/Alertness: Awake/alert Behavior During Therapy: WFL for tasks assessed/performed Overall Cognitive Status: Within Functional Limits for tasks assessed   Blood pressure 120/73, pulse 79, temperature 99.3 F (37.4 C), temperature source Oral, resp. rate 16, height 6' (1.829 m), weight 90.7 kg (200 lb), SpO2 94 %. Physical Exam  Nursing note and vitals reviewed. Constitutional: He is oriented to person, place, and time. He appears well-developed and well-nourished.  Sedated and had difficult time keeping eyes open initially.   HENT:  Head: Normocephalic and atraumatic.  Healing abrasions right forehead.    Eyes: Conjunctivae and EOM are normal. Pupils are equal, round, and reactive to light.  Right periorbital ecchymosis  Neck: Normal range of motion. Neck supple.  Cardiovascular: Normal rate and regular rhythm.   Respiratory: Effort normal and breath sounds normal. He has no wheezes.  GI: Soft. Bowel sounds are normal. He exhibits no distension. There is no tenderness.  Musculoskeletal: He exhibits edema.  RUE in sling. Left forearm splinted. Pelvic pain with ROM BLE.   Neurological: He is alert and oriented to person, place, and time.  Skin: Skin is warm and dry.  Psychiatric: He has a normal mood and affect. His behavior is normal. Thought content  normal.  Right upper extremity. Weak grip, in sling. Does have some elbow pain, even with active shoulder movement. Left upper extremity distal wrist splint with Ace wrap. Able to move fingers, 4 minus. Grip normal. Deltoid and biceps and triceps. Lower extremity limited by pain. 4 minus. Hip flexor, knee extensor, ankle dorsiflexor. Sensation intact to light touch bilateral upper and lower limbs. Follow simple and complex commands, is drowsy. No evidence of agitation or impulsivity.  No results found for this or any previous visit (from the past 24 hour(s)). Dg Elbow Complete Left  Result Date: 02/07/2016 CLINICAL DATA:  [place] off 2 story roof today with pain and swelling EXAM: LEFT ELBOW - COMPLETE 3+ VIEW COMPARISON:  None. FINDINGS: There is no evidence of fracture, dislocation, or joint effusion. There is no evidence of arthropathy or other focal bone abnormality. Soft tissues are unremarkable. IMPRESSION: Negative. Electronically Signed   By: Donavan Foil M.D.   On: 02/07/2016 19:30   Dg Elbow Complete Right  Result Date: 02/07/2016 CLINICAL DATA:  Right elbow pain after fall off roof. EXAM: RIGHT ELBOW - COMPLETE 3+ VIEW COMPARISON:  None. FINDINGS: Minimally displaced fracture is seen involving the radial head with intra-articular extension. The joint has been splinted. No soft tissue abnormality is noted. IMPRESSION: Minimally displaced radial head fracture with intra-articular extension. Electronically Signed   By: Jeneen Rinks  Murlean Caller, M.D.   On: 02/07/2016 14:06   Dg Forearm Right  Result Date: 02/07/2016 CLINICAL DATA:  Fall. EXAM: RIGHT FOREARM - 2 VIEW COMPARISON:  No prior. FINDINGS: Angulated displaced fracture of the radial head is noted. Fracture extends into the radiocarpal joint space. No other focal abnormality identified. IMPRESSION: Angulated displaced fracture of the radial head. Electronically Signed   By: Marcello Moores  Register   On: 02/07/2016 12:19   Dg Wrist Complete  Left  Result Date: 02/07/2016 CLINICAL DATA:  Fall from roof with pain and swelling EXAM: LEFT WRIST - COMPLETE 3+ VIEW COMPARISON:  None. FINDINGS: There is possible distal radius intra-articular fracture, best seen on the lateral projection with dorsal soft tissue swelling. This is not well visualized on the other views obtained. Distal ulna appears intact. IMPRESSION: Possible nondisplaced intra-articular distal radius fracture. Electronically Signed   By: Donavan Foil M.D.   On: 02/07/2016 19:31   Ct Head Wo Contrast  Result Date: 02/07/2016 CLINICAL DATA:  Status post fall from [place] height of 2 stories today. Initial encounter. EXAM: CT HEAD WITHOUT CONTRAST CT CERVICAL SPINE WITHOUT CONTRAST TECHNIQUE: Multidetector CT imaging of the head and cervical spine was performed following the standard protocol without intravenous contrast. Multiplanar CT image reconstructions of the cervical spine were also generated. COMPARISON:  None. FINDINGS: CT HEAD FINDINGS Brain: Appears normal without hemorrhage, infarct, mass lesion, mass effect, midline shift or abnormal extra-axial fluid collection. No hydrocephalus or pneumocephalus. Vascular: Appear normal. Skull: The patient has right nasal bone fractures with slight impaction. Nondisplaced fracture of the right orbital roof is identified. The fracture extends into the right frontal bone without displacement. Sinuses/Orbits: Mild mucosal thickening left sphenoid sinus is identified. There is also scattered ethmoid air cell disease. Other: None. CT CERVICAL SPINE FINDINGS Alignment: Maintained. Skull base and vertebrae: Mild superior endplate compression fracture of T1 eccentric to the left is identified. Vertebral body height loss is mild at approximately 15%. No extension into the posterior elements is identified. No other fracture is identified. Soft tissues and spinal canal: New Disc levels: 1 central canal and foramina appear open. Minimal disc bulging in the  midcervical spine is noted. Upper chest: Negative. Other: None. IMPRESSION: Fractures of the right nasal bone, right orbital roof and right frontal bone. Negative for acute intracranial abnormality. Mild superior endplate compression fracture of T1. No involvement of the posterior elements or central canal compromise is identified. Critical Value/emergent results were called by telephone at the time of interpretation on 02/07/2016 at 12:29 pm to Dr. Merrily Pew , who verbally acknowledged these results. Electronically Signed   By: Inge Rise M.D.   On: 02/07/2016 12:32   Ct Chest W Contrast  Result Date: 02/07/2016 CLINICAL DATA:  Pain following trauma/fall EXAM: CT CHEST, ABDOMEN, AND PELVIS WITH CONTRAST TECHNIQUE: Multidetector CT imaging of the chest, abdomen and pelvis was performed following the standard protocol during bolus administration of intravenous contrast. CONTRAST:  17mL ISOVUE-300 IOPAMIDOL (ISOVUE-300) INJECTION 61% COMPARISON:  None. FINDINGS: CT CHEST FINDINGS Cardiovascular: There is no demonstrable mediastinal hematoma. There is no thoracic aortic aneurysm or dissection. No aortic mucosal lesion or periaortic fluid evident. No evident contrast extravasation. The visualized great vessels appear unremarkable. The pericardium is not appreciably thickened. Mediastinum/Nodes: Visualized thyroid appears unremarkable. There is no appreciable thoracic adenopathy. Lungs/Pleura: There is no lung edema or consolidation. No pneumothorax or contusion. There is slight bibasilar atelectasis. There is no pleural effusion or pleural thickening evident. Musculoskeletal: There is no fracture  or dislocation evident in thoracic region. No blastic or lytic bone lesions. No chest wall lesions evident. CT ABDOMEN PELVIS FINDINGS Hepatobiliary: Liver appears intact without laceration or rupture. No perihepatic fluid. No focal liver lesions are evident. Gallbladder wall is not appreciably thickened. There  is no biliary duct dilatation. Pancreas: Pancreas appears intact. No peripancreatic fluid. No pancreatic mass or inflammatory focus. Spleen: Spleen appears intact without laceration or rupture. No perisplenic fluid. No focal splenic lesions. Adrenals/Urinary Tract: Adrenals appear normal bilaterally. Kidneys bilaterally show no evidence of mass or hydronephrosis. There is no perinephric fluid or stranding. No contrast extravasation. No renal laceration or rupture. No renal or ureteral calculus is evident on either side. Urinary bladder is midline with wall thickness within normal limits. Stomach/Bowel: There is no appreciable bowel wall or mesenteric thickening. There is no bowel obstruction. No free air or portal venous air. Vascular/Lymphatic: Aorta appears intact. There is no periaortic fluid. No abdominal aortic aneurysm. Major mesenteric vessels appear patent. No vascular lesions are evident. There is no adenopathy in the abdomen or pelvis. Reproductive: Prostate and seminal vesicles appear normal in size and contour. There are a few tiny prostatic calculi. There is no pelvic mass or pelvic fluid collection. Other: The appendix appears normal. There is no ascites or abscess in the abdomen or pelvis. There are no intraperitoneal or retroperitoneal fluid collections in the abdomen or pelvis. Musculoskeletal: There is a comminuted fracture of the right sacral ala. Mild anterior displacement of a fracture fragment along the anterior sacral ala on the right is noted. There is a fracture of the superior left pubic symphysis in near anatomic alignment. There is a fracture of the each anterior acetabular region without fracture fragments extending into the respective hip joints. No other fractures are evident. No dislocations. There are several Schmorl's nodes in the upper lumbar region, an anatomic variant. There are no blastic or lytic bone lesions. There is no intramuscular or abdominal wall lesion. IMPRESSION: CT  chest: No vascular lesion evident. No fracture or dislocation. Slight bibasilar atelectasis without parenchymal lung contusion or pneumothorax. No edema or consolidation. No adenopathy. CT abdomen and pelvis: Comminuted fracture right sacral ala. Fracture along the superior left pubic symphysis in near anatomic alignment. Fractures of each anterior acetabular region without fracture fragments extending into the respective hip joints. Visceral structures appear intact without laceration or rupture. No bowel wall thickening or bowel obstruction. No abscess. No abnormal fluid collections. Appendix appears normal. No renal or ureteral calculi. No hydronephrosis. There are a few tiny prostatic calculi. No vascular lesion evident. Electronically Signed   By: Lowella Grip III M.D.   On: 02/07/2016 12:24   Ct Cervical Spine Wo Contrast  Result Date: 02/07/2016 CLINICAL DATA:  Status post fall from [place] height of 2 stories today. Initial encounter. EXAM: CT HEAD WITHOUT CONTRAST CT CERVICAL SPINE WITHOUT CONTRAST TECHNIQUE: Multidetector CT imaging of the head and cervical spine was performed following the standard protocol without intravenous contrast. Multiplanar CT image reconstructions of the cervical spine were also generated. COMPARISON:  None. FINDINGS: CT HEAD FINDINGS Brain: Appears normal without hemorrhage, infarct, mass lesion, mass effect, midline shift or abnormal extra-axial fluid collection. No hydrocephalus or pneumocephalus. Vascular: Appear normal. Skull: The patient has right nasal bone fractures with slight impaction. Nondisplaced fracture of the right orbital roof is identified. The fracture extends into the right frontal bone without displacement. Sinuses/Orbits: Mild mucosal thickening left sphenoid sinus is identified. There is also scattered ethmoid air cell disease. Other:  None. CT CERVICAL SPINE FINDINGS Alignment: Maintained. Skull base and vertebrae: Mild superior endplate compression  fracture of T1 eccentric to the left is identified. Vertebral body height loss is mild at approximately 15%. No extension into the posterior elements is identified. No other fracture is identified. Soft tissues and spinal canal: New Disc levels: 1 central canal and foramina appear open. Minimal disc bulging in the midcervical spine is noted. Upper chest: Negative. Other: None. IMPRESSION: Fractures of the right nasal bone, right orbital roof and right frontal bone. Negative for acute intracranial abnormality. Mild superior endplate compression fracture of T1. No involvement of the posterior elements or central canal compromise is identified. Critical Value/emergent results were called by telephone at the time of interpretation on 02/07/2016 at 12:29 pm to Dr. Merrily Pew , who verbally acknowledged these results. Electronically Signed   By: Inge Rise M.D.   On: 02/07/2016 12:32   Ct Abdomen Pelvis W Contrast  Result Date: 02/07/2016 CLINICAL DATA:  Pain following trauma/fall EXAM: CT CHEST, ABDOMEN, AND PELVIS WITH CONTRAST TECHNIQUE: Multidetector CT imaging of the chest, abdomen and pelvis was performed following the standard protocol during bolus administration of intravenous contrast. CONTRAST:  186mL ISOVUE-300 IOPAMIDOL (ISOVUE-300) INJECTION 61% COMPARISON:  None. FINDINGS: CT CHEST FINDINGS Cardiovascular: There is no demonstrable mediastinal hematoma. There is no thoracic aortic aneurysm or dissection. No aortic mucosal lesion or periaortic fluid evident. No evident contrast extravasation. The visualized great vessels appear unremarkable. The pericardium is not appreciably thickened. Mediastinum/Nodes: Visualized thyroid appears unremarkable. There is no appreciable thoracic adenopathy. Lungs/Pleura: There is no lung edema or consolidation. No pneumothorax or contusion. There is slight bibasilar atelectasis. There is no pleural effusion or pleural thickening evident. Musculoskeletal: There is  no fracture or dislocation evident in thoracic region. No blastic or lytic bone lesions. No chest wall lesions evident. CT ABDOMEN PELVIS FINDINGS Hepatobiliary: Liver appears intact without laceration or rupture. No perihepatic fluid. No focal liver lesions are evident. Gallbladder wall is not appreciably thickened. There is no biliary duct dilatation. Pancreas: Pancreas appears intact. No peripancreatic fluid. No pancreatic mass or inflammatory focus. Spleen: Spleen appears intact without laceration or rupture. No perisplenic fluid. No focal splenic lesions. Adrenals/Urinary Tract: Adrenals appear normal bilaterally. Kidneys bilaterally show no evidence of mass or hydronephrosis. There is no perinephric fluid or stranding. No contrast extravasation. No renal laceration or rupture. No renal or ureteral calculus is evident on either side. Urinary bladder is midline with wall thickness within normal limits. Stomach/Bowel: There is no appreciable bowel wall or mesenteric thickening. There is no bowel obstruction. No free air or portal venous air. Vascular/Lymphatic: Aorta appears intact. There is no periaortic fluid. No abdominal aortic aneurysm. Major mesenteric vessels appear patent. No vascular lesions are evident. There is no adenopathy in the abdomen or pelvis. Reproductive: Prostate and seminal vesicles appear normal in size and contour. There are a few tiny prostatic calculi. There is no pelvic mass or pelvic fluid collection. Other: The appendix appears normal. There is no ascites or abscess in the abdomen or pelvis. There are no intraperitoneal or retroperitoneal fluid collections in the abdomen or pelvis. Musculoskeletal: There is a comminuted fracture of the right sacral ala. Mild anterior displacement of a fracture fragment along the anterior sacral ala on the right is noted. There is a fracture of the superior left pubic symphysis in near anatomic alignment. There is a fracture of the each anterior  acetabular region without fracture fragments extending into the respective hip joints. No  other fractures are evident. No dislocations. There are several Schmorl's nodes in the upper lumbar region, an anatomic variant. There are no blastic or lytic bone lesions. There is no intramuscular or abdominal wall lesion. IMPRESSION: CT chest: No vascular lesion evident. No fracture or dislocation. Slight bibasilar atelectasis without parenchymal lung contusion or pneumothorax. No edema or consolidation. No adenopathy. CT abdomen and pelvis: Comminuted fracture right sacral ala. Fracture along the superior left pubic symphysis in near anatomic alignment. Fractures of each anterior acetabular region without fracture fragments extending into the respective hip joints. Visceral structures appear intact without laceration or rupture. No bowel wall thickening or bowel obstruction. No abscess. No abnormal fluid collections. Appendix appears normal. No renal or ureteral calculi. No hydronephrosis. There are a few tiny prostatic calculi. No vascular lesion evident. Electronically Signed   By: Lowella Grip III M.D.   On: 02/07/2016 12:24   Dg Pelvis Portable  Result Date: 02/07/2016 CLINICAL DATA:  Fall from roof.  Extremity pain.  Initial encounter. EXAM: PORTABLE PELVIS 1-2 VIEWS COMPARISON:  None. FINDINGS: No diastasis or displaced fracture. On the second of two views of the pelvis there is a left superior pubic body fracture which favors avulsion injury. Both hips are located. IMPRESSION: Probable avulsion fracture from the superior left pubic body. Electronically Signed   By: Monte Fantasia M.D.   On: 02/07/2016 11:55   Dg Chest Portable 1 View  Result Date: 02/07/2016 CLINICAL DATA:  [place] off 2 story roof. Right chest and arm pain. Initial encounter. EXAM: PORTABLE CHEST 1 VIEW COMPARISON:  None. FINDINGS: The heart size and mediastinal contours are within normal limits. Both lungs are clear. No evidence of  pneumothorax or hemothorax. The visualized skeletal structures are unremarkable. IMPRESSION: No active disease. Electronically Signed   By: Earle Gell M.D.   On: 02/07/2016 11:54   Dg Humerus Right  Result Date: 02/07/2016 CLINICAL DATA:  Fall. EXAM: RIGHT HUMERUS - 2+ VIEW COMPARISON:  No prior. FINDINGS: No acute bony or joint abnormality identified. No evidence of fracture or dislocation. IMPRESSION: No acute or focal abnormality. Electronically Signed   By: Marcello Moores  Register   On: 02/07/2016 12:17    Assessment/Plan: Diagnosis: Multitrauma with right radial head fracture, left nondisplaced wrist fracture, multiple pelvic fractures as well as T1 fracture , weightbearing as tolerated. Bilateral lower extremities and left upper extremity. 1. Does the need for close, 24 hr/day medical supervision in concert with the patient's rehab needs make it unreasonable for this patient to be served in a less intensive setting? Potentially 2. Co-Morbidities requiring supervision/potential complications: Mild traumatic brain injury 3. Due to bladder management, bowel management and pain management, does the patient require 24 hr/day rehab nursing? Potentially 4. Does the patient require coordinated care of a physician, rehab nurse, PT, OT to address physical and functional deficits in the context of the above medical diagnosis(es)? Potentially Addressing deficits in the following areas: balance, endurance, locomotion, strength, transferring, bowel/bladder control, bathing and dressing 5. Can the patient actively participate in an intensive therapy program of at least 3 hrs of therapy per day at least 5 days per week? Yes 6. The potential for patient to make measurable gains while on inpatient rehab is good 7. Anticipated functional outcomes upon discharge from inpatient rehab are n/a  with PT, n/a with OT, n/a with SLP. 8. Estimated rehab length of stay to reach the above functional goals is: NA 9. Does the  patient have adequate social supports and living environment  to accommodate these discharge functional goals? Yes 10. Anticipated D/C setting: Home 11. Anticipated post D/C treatments: [place] therapy 12. Overall Rehab/Functional Prognosis: excellent  RECOMMENDATIONS: This patient's condition is appropriate for continued rehabilitative care in the following setting: At this point pt would benefit from short inpt stay but has strong family support that would enable him to go home with [facility] Patient has agreed to participate in recommended program. N/A Note that insurance prior authorization may be required for reimbursement for recommended care.  Comment: No significant brain injury, unresolved medical issue includes urinary retention. If patient fails a voiding trial, may need to go home with a Foley catheter and urology follow-up. If functional status worsens, please reconsult.   Bary Leriche, [place] 02/09/2016

## 2016-02-09 NOTE — Consult Note (Signed)
   [facility] [address] [facility] Inpatient Consult   02/09/2016  Rhyder Pevey 12-18-1985 [id number]    Came to bedside to speak with Mr. Spohr and wife on behalf of Link to [facility] Care Management program for [facility] employees/dependents with [facility] insurance. Denies having any current Link to Wellness needs. 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 Provided contact information and discussed post hospital discharge follow up call. Confirmed that Farrel Giesel (wife) should be called for post discharge call at [phone].  Appreciative of visit.    Marthenia Rolling, MSN-Ed, RN,BSN [facility] Liaison [phone]

## 2016-02-09 NOTE — Evaluation (Signed)
Occupational Therapy Evaluation and Discharge Patient Details Name: William Holt MRN: [id number] DOB: Mar 04, 1985 Today's Date: 02/09/2016    History of Present Illness pt presents after falling from ladder at 2nd floor height.  pt with + LOC and sustained R Orbital fx, R Frontal fx, R Nasal fx, Bil Radial fxs, T1 fx, R Sacral fx, Bil Acetabular fxs, and L Pubic fx.  No PMH.     Clinical Impression   This 30 yo male admitted with above presents to acute OT with all OT education completed with pt and family. Pt/family provided concussion handout. Acute OT will sign off without need for follow up.    Follow Up Recommendations  Supervision/Assistance - 24 hour    Equipment Recommendations  None recommended by OT       Precautions / Restrictions Precautions Precautions: Fall;Back Precaution Booklet Issued: No Required Braces or Orthoses: Sling;Other Brace/Splint Other Brace/Splint: L wrist splint Restrictions Weight Bearing Restrictions: Yes RUE Weight Bearing: Weight bear through elbow only LUE Weight Bearing: Weight bearing as tolerated RLE Weight Bearing: Weight bearing as tolerated LLE Weight Bearing: Weight bearing as tolerated      Mobility Bed Mobility Overal bed mobility: Needs Assistance     General bed mobility comments: Pt up in recliner upon arrival  Transfers Overall transfer level: Needs assistance Equipment used: 1 person hand held assist Transfers: Sit to/from Stand Sit to Stand: Min guard         General transfer comment: Pt ambulated 20 feet with LUE HHA without with min A (would do better with a RPFRW)    Balance Overall balance assessment: Needs assistance Sitting-balance support: No upper extremity supported;Feet supported Sitting balance-Leahy Scale: Fair     Standing balance support: Single extremity supported;During functional activity Standing balance-Leahy Scale: Poor                              ADL Overall ADL's :  Needs assistance/impaired Eating/Feeding: Set up;Sitting   Grooming: Set up;Sitting   Upper Body Bathing: Minimal assistance;Sitting   Lower Body Bathing: Maximal assistance (with min guard A sit<>stand from recliner)   Upper Body Dressing : Moderate assistance;Sitting Upper Body Dressing Details (indicate cue type and reason): Educate pt/family on pt could wear either button up or pullover shirts. For pullover RUE in first, then head, then LUE (reverse to take shirt off); for button up RUE in first then LUE (reverse to take off) Lower Body Dressing: Total assistance (with mingaurd A sit<>stand from recliner) Lower Body Dressing Details (indicate cue type and reason): Educated pt/family on most efficient sequence of getting dressed and easier clothes to dress in       [place] Manipulation Details (indicate cue type and reason): Educate family on use of Saran wrap press and seal to cover LUE splint for pt to attempt peri-care; Recommended pt wear elastic waist clothing   Tub/Shower Transfer Details (indicate cue type and reason): Pt has walk in shower and built in seat. Spoke with pt/family and they are going to get a handheld shower head and some of the suction cup grab bars (made them aware that these are only for steading oneself not to support weight and that if they get them the bars need to be re-set before each shower)                   Pertinent Vitals/Pain Pain Assessment: 0-10 Pain Score: 3  Pain  Location: R pelvis with weight bearing Pain Descriptors / Indicators: Aching;Sore Pain Intervention(s): Monitored during session;Premedicated before session;Repositioned     Hand Dominance Right   Extremity/Trunk Assessment Upper Extremity Assessment Upper Extremity Assessment: RUE deficits/detail;LUE deficits/detail RUE Deficits / Details: Decreased active movement at due to pain; can use it functionally for most part just no totally just [place] RUE  Coordination: decreased gross motor LUE Deficits / Details: splint on LUE LUE Coordination: decreased gross motor           Communication Communication Communication: No difficulties   Cognition Arousal/Alertness: Lethargic;Suspect due to medications (but able to follow our conversation with only 1 delay of one response) Behavior During Therapy: [facility] for tasks assessed/performed Overall Cognitive Status: Within Functional Limits for tasks assessed                                Home Living Family/patient expects to be discharged to:: Private residence Living Arrangements: Spouse/significant other;Children Available Help at Discharge: Family;Available 24 hours/day Type of Home: House Home Access: Stairs to enter [employer] of Steps: 3 Entrance Stairs-Rails: Right;Left Home Layout: Two level;Able to live on main level with bedroom/bathroom     Bathroom Shower/Tub: Occupational psychologist: Handicapped height     Home Equipment: Shower seat - built in;Hand held shower head   Additional Comments: family going to put grab bar for [employer] and in showers  Lives With: Spouse;Son (son is 22 months old)    Prior Functioning/Environment Level of Independence: Independent                       OT Goals(Current goals can be found in the care plan section) Acute Rehab OT Goals Patient Stated Goal: Home   OT Frequency:                End of Session Equipment Utilized During Treatment: Gait belt Nurse Communication:  (placed pt back on 1 liter of O2 due to sats dropping to mid 80's at times)  Activity Tolerance: Patient tolerated treatment well Patient left: in chair;with call bell/phone within reach;with family/visitor present   Time: 1206-1240 OT Time Calculation (min): 34 min Charges:  OT General Charges $OT Visit: 1 Procedure OT Evaluation $OT Eval Moderate Complexity: 1 Procedure OT Treatments $Self Care/Home Management : 8-22  mins  Almon Register [id number] 02/09/2016, 1:08 PM

## 2016-02-09 NOTE — Progress Notes (Addendum)
I met with pt and his parents at bedside. He has progressed well with therapy and we recommend d/c home eat this time. Please call me with any questions.  I have alerted RN Bryan and SW. [phone]

## 2016-02-09 NOTE — Progress Notes (Signed)
Patient ID: William Holt, male   DOB: [birth year], 30 y.o.   MRN: [id number]   LOS: 2 days   Subjective: Having significant nausea that the Zofran is not very effective for.   Objective: Vital signs in last 24 hours: Temp:  [98.4 F (36.9 C)-99.3 F (37.4 C)] 99.3 F (37.4 C) (12/21 0445) Pulse Rate:  [86-97] 95 (12/21 0445) Resp:  [12-17] 16 (12/21 0445) BP: (118-130)/(64-83) 120/73 (12/21 0445) SpO2:  [92 %-97 %] 97 % (12/21 0445) Last BM Date: 02/07/16   Physical Exam General appearance: alert and no distress Resp: clear to auscultation bilaterally Cardio: regular rate and rhythm GI: normal findings: bowel sounds normal and soft, non-tender Extremities: Warm, sensation intact   Assessment/Plan: Fall from roof Concussion Frontal skull fx-- Non-op per Dr. Kathyrn Sheriff Right orbit fx -- Non-op per Dr. Redmond Baseman Right radial head fx-- Platform WB per Dr. Erlinda Hong Left wrist fx -- WBAT in splint per Dr. Erlinda Hong T1 fx-- No treatment necessary per Dr. Kathyrn Sheriff Multiple pelvic fxs-- WBAT per Dr. Erlinda Hong Urinary retention -- Foley replaced FEN -- Will add scheduled tramadol to try and decrease narcotic burden, aid urinary retention. Change antiemetic to Phenergan. VTE -- SCD's, Lovenox Dispo -- PT/OT, CIR consult    [name], PA-C Pager: [phone] General Trauma PA Pager: [phone]  02/09/2016

## 2016-02-10 MED ORDER — TAMSULOSIN HCL 0.4 MG PO CAPS: 0.4000 mg | ORAL_CAPSULE | Freq: Every day | ORAL | 0 refills | Status: DC

## 2016-02-10 MED ORDER — TAMSULOSIN HCL 0.4 MG PO CAPS: 0.4000 mg | ORAL_CAPSULE | Freq: Every day | ORAL | 0 refills | Status: AC

## 2016-02-10 MED ORDER — BETHANECHOL CHLORIDE 25 MG PO TABS: 25.0000 mg | ORAL_TABLET | Freq: Four times a day (QID) | ORAL | 0 refills | Status: AC

## 2016-02-10 MED ORDER — PROMETHAZINE HCL 12.5 MG PO TABS: 12.5000 mg | ORAL_TABLET | Freq: Four times a day (QID) | ORAL | 1 refills | Status: DC | PRN

## 2016-02-10 MED ORDER — BETHANECHOL CHLORIDE 25 MG PO TABS: 25.0000 mg | ORAL_TABLET | Freq: Four times a day (QID) | ORAL | 0 refills | Status: DC

## 2016-02-10 MED ORDER — TRAMADOL HCL 50 MG PO TABS: 100.0000 mg | ORAL_TABLET | Freq: Four times a day (QID) | ORAL | 0 refills | Status: DC

## 2016-02-10 MED ORDER — OXYCODONE HCL 5 MG PO TABS: 5.0000 mg | ORAL_TABLET | ORAL | 0 refills | Status: DC | PRN

## 2016-02-10 MED FILL — PROMETHAZINE 12.5 MG TABLET: 12.5 | 3 days supply | Qty: 20 | Fill #0

## 2016-02-10 NOTE — Progress Notes (Signed)
Patient ID: William Holt, male   DOB: 1985/10/11, 30 y.o.   MRN: [id number]   LOS: 3 days   Subjective: No new c/o, foley removed about 2h ago, no voiding yet   Objective: Vital signs in last 24 hours: Temp:  [98.1 F (36.7 C)-99.8 F (37.7 C)] 98.2 F (36.8 C) (12/22 0609) Pulse Rate:  [80-92] 84 (12/22 0609) Resp:  [16-17] 16 (12/22 0609) BP: (116-127)/(61-68) 121/64 (12/22 0609) SpO2:  [94 %-96 %] 96 % (12/22 0609) Last BM Date: 02/07/16   Physical Exam General appearance: alert and no distress Resp: clear to auscultation bilaterally Cardio: regular rate and rhythm GI: normal findings: bowel sounds normal and soft, non-tender Pulses: 2+ and symmetric   Assessment/Plan: Fall from roof Concussion Frontal skull fx-- Non-opper Dr. Kathyrn Sheriff Right orbit fx -- Non-op per Dr. Redmond Baseman Right radial head fx-- Platform WB per Dr. Erlinda Hong Left wrist fx-- WBAT in splint per Dr. Erlinda Hong T1 fx-- No treatment necessary per Dr. Kathyrn Sheriff Multiple pelvic fxs-- WBAT per Dr. Erlinda Hong Urinary retention-- Voiding trial today FEN -- No issues VTE-- SCD's, Lovenox Dispo-- PT/OT, home if pt can void either tonight or tomorrow    William Abu, PA-C Pager: [phone] General Trauma PA Pager: [phone]  02/10/2016

## 2016-02-10 NOTE — Care Management Note (Addendum)
Case Management Note  Patient Details  Name: Geza Beranek MRN: [id number] Date of Birth: 05-22-1985  Subjective/Objective:   Pt medically stable for discharge today.  PT recommending follow up therapy, DME.                   Action/Plan: Met with pt and family to discuss dc arrangements.  Pt declines 3 in 1, as he has a high toilet seat, and a seat in his shower.  Rt platform RW requested for home; referral to [facility] for DME needs.  Referral to [facility] for [facility] follow up, per pt/family choice.  Start of care likely 12/26, due to holiday weekend.  Pt/family made aware of this.    Expected Discharge Date:    02/10/2016             Expected Discharge Plan:  [place]  In-House Referral:     Discharge planning Services  CM Consult  Post Acute Care Choice:  Durable Medical Equipment, Home Health Choice offered to:  Patient  DME Arranged:  Gilford Rile platform DME Agency:  [place] Arranged: PT [facility] Agency:  [place]  Status of Service:  Completed, signed off  If discussed at [place] of Stay Meetings, dates discussed:    Additional Comments:  Reinaldo Raddle, RN, BSN  Trauma/Neuro ICU Case Manager [phone]

## 2016-02-10 NOTE — Progress Notes (Signed)
Orthopedic Tech Progress Note Patient Details:  William Holt [birth year] [id number]  Ortho Devices Type of Ortho Device: Volar splint Ortho Device/Splint Location: Applied volar Splint to left hand/wrist as ordered. Pt tolerated well. Family was at bedside.  Explained instructions and care of device to pt's mother. Ortho Device/Splint Interventions: Application   Kristopher Oppenheim 02/10/2016, 12:53 PM

## 2016-02-10 NOTE — Progress Notes (Signed)
Orthopedic Tech Progress Note Patient Details:  William Holt 07-14-1985 [id number]  Ortho Devices Type of Ortho Device: Velcro wrist forearm splint Ortho Device/Splint Location: Applied volar Splint to left hand/wrist as ordered. Pt tolerated well. Family was at bedside.  Explained instructions and care of device to pt's mother. Ortho Device/Splint Interventions: Application   [place] Pink 02/10/2016, 4:22 PM

## 2016-02-10 NOTE — Progress Notes (Signed)
Discharge instructions and Prescriptions reviewed with patient. Patient stated understanding. IV removed. Patient has transportation at bedside [place], Therapist, sports

## 2016-02-10 NOTE — Progress Notes (Signed)
Physical Therapy Treatment Patient Details Name: William Holt MRN: [id number] DOB: 1985/04/05 Today's Date: 02/10/2016    History of Present Illness pt presents after falling from ladder at 2nd floor height.  pt with + LOC and sustained R Orbital fx, R Frontal fx, R Nasal fx, Bil Radial fxs, T1 fx, R Sacral fx, Bil Acetabular fxs, and L Pubic fx.  No PMH.      PT Comments    Patient is progressing well toward mobility goals and tolerated stair training and increased gait distance this session. Overall pt is min guard/supervision for mobility. Pt is impulsive at times but family assured he will have 24 hour assist/supervision upon d/c home. Current plan remains appropriate.   Follow Up Recommendations  Home health PT     Equipment Recommendations  Rolling walker with 5" wheels;3in1 (PT)    Recommendations for Other Services       Precautions / Restrictions Precautions Precautions: Fall;Back Precaution Booklet Issued: No Required Braces or Orthoses: Sling;Other Brace/Splint Other Brace/Splint: L wrist splint Restrictions Weight Bearing Restrictions: Yes RUE Weight Bearing: Weight bear through elbow only LUE Weight Bearing: Weight bearing as tolerated RLE Weight Bearing: Weight bearing as tolerated LLE Weight Bearing: Weight bearing as tolerated    Mobility  Bed Mobility               General bed mobility comments: pt sitting EOB with mother present  Transfers Overall transfer level: Needs assistance Equipment used: Right platform walker Transfers: Sit to/from Stand Sit to Stand: Min guard         General transfer comment: cues for safety; min guard with no physical assist required  Ambulation/Gait Ambulation/Gait assistance: Min guard Ambulation Distance (Feet): 150 Feet Assistive device: Right platform walker Gait Pattern/deviations: Step-through pattern;Decreased stride length     General Gait Details: slow, steady gait   Stairs Stairs: Yes   Stair  Management: One rail Left;Step to pattern;Forwards Number of Stairs:  (2steps X2) General stair comments: cues for step to pattern and safety  Wheelchair Mobility    Modified Rankin (Stroke Patients Only)       Balance     Sitting balance-Leahy Scale: Good       Standing balance-Leahy Scale: Fair (static stand)                      Cognition Arousal/Alertness: Awake/alert Behavior During Therapy: WFL for tasks assessed/performed Overall Cognitive Status: Within Functional Limits for tasks assessed                      Exercises      General Comments General comments (skin integrity, edema, etc.): pt impulsive at times      Pertinent Vitals/Pain Pain Assessment: Faces Faces Pain Scale: Hurts little more Pain Location: R side pelvis with mobility Pain Descriptors / Indicators: Aching;Sore Pain Intervention(s): Limited activity within patient's tolerance;Monitored during session;Premedicated before session;Repositioned    Home Living                      Prior Function            PT Goals (current goals can now be found in the care plan section) Acute Rehab PT Goals Patient Stated Goal: Home  Progress towards PT goals: Progressing toward goals    Frequency    Min 5X/week      PT Plan Current plan remains appropriate    Co-evaluation  End of Session Equipment Utilized During Treatment: Gait belt Activity Tolerance: Patient tolerated treatment well Patient left: in bed;with call bell/phone within reach;with family/visitor present     Time: [id number] PT Time Calculation (min) (ACUTE ONLY): 25 min  Charges:  $Gait Training: 8-22 mins $Therapeutic Activity: 8-22 mins                    G Codes:      [place] April, PTA Pager: [phone]   02/10/2016, 3:31 PM

## 2016-02-14 ENCOUNTER — Other Ambulatory Visit: Payer: Self-pay | Admitting: *Deleted

## 2016-02-14 DIAGNOSIS — S32810D Multiple fractures of pelvis with stable disruption of pelvic ring, subsequent encounter for fracture with routine healing: Secondary | ICD-10-CM | POA: Diagnosis not present

## 2016-02-14 DIAGNOSIS — S52121D Displaced fracture of head of right radius, subsequent encounter for closed fracture with routine healing: Secondary | ICD-10-CM | POA: Diagnosis not present

## 2016-02-14 DIAGNOSIS — S52501D Unspecified fracture of the lower end of right radius, subsequent encounter for closed fracture with routine healing: Secondary | ICD-10-CM | POA: Diagnosis not present

## 2016-02-14 NOTE — Patient Outreach (Signed)
Attempted to reach patient's wife , William Holt, at mobile number provided per outreach directions provided in [place] note of 12/21. Patient William Holt was admitted to [facility] on 02/07/16 after falling from second story roof with loss of consciousness, frontal skull fracture, right orbit fracture, T1 fracture, left wrist fracture,  and multiple pelvic fractures. He was discharged to home on 12/22 with orders for rolling walker with right platform, and home health physical therapy services from [place]. This RNCM left message on wife's mobile number advising another transition of care outreach will be attempted tomorrow. Barrington Ellison RN,CCM,CDE [place] Management Coordinator Link To Wellness Office Phone [phone] Office Fax [phone]

## 2016-02-15 ENCOUNTER — Other Ambulatory Visit: Payer: Self-pay

## 2016-02-15 ENCOUNTER — Telehealth (INDEPENDENT_AMBULATORY_CARE_PROVIDER_SITE_OTHER): Payer: Self-pay | Admitting: Orthopaedic Surgery

## 2016-02-15 ENCOUNTER — Encounter: Payer: Self-pay | Admitting: Family Medicine

## 2016-02-15 NOTE — Telephone Encounter (Signed)
Patient was seen at [facility] 02/07/16, fell off a roof breaking multiple bones. He is to followup with xu next week, until then he thinks he has a UTI and is requesting an antibiotic, also says he has 2 days worth of tramadol left and 10 oxycodone. He has questions about refills (whether or not he will continue this medication, if so he needs refills ) or is he will be taking something else.  Cb#: [phone]

## 2016-02-15 NOTE — Patient Outreach (Signed)
[facility]) Care Management  02/15/2016  William Holt 07-28-1985 [id number]   Transition of care phone call.  Spoke to wife William Holt per members request. Member was admitted to [facility] on 02/07/16 after falling from second story roof with loss of consciousness, frontal skull fracture, right orbit fracture, T1 fracture, left wrist fracture,  and multiple pelvic fractures. He was discharged to home on 02/10/16. Wife states that member is moving around more and he walked with PT at home yesterday.  States that [place] is sending PT.  States he is to see orthopedic MD on 02/21/16.  States he is taking pain medications as needed and he has medications ordered post discharge.  States his bowels are moving without difficulty.  States she has submitted her FMLA forms.  States she is assisting member as needed and he has the equipment that he needs.  Denies any further case management needs. Instructed on s/sx to call MD.  Instructed on [place] Management services and to contact RNCM if needs change. Member assessed with no further interventions needed.  Member has follow up appt with Dr. Erlinda Hong on 02/21/16 and has [place] in place.  Plan to send successful outreach letter.  Peter Garter RN, [facility] Care Management Coordinator-Link to [place] Management [phone]

## 2016-02-15 NOTE — Telephone Encounter (Signed)
Please advise 

## 2016-02-16 ENCOUNTER — Telehealth (INDEPENDENT_AMBULATORY_CARE_PROVIDER_SITE_OTHER): Payer: Self-pay | Admitting: Orthopaedic Surgery

## 2016-02-16 MED ORDER — GABAPENTIN 100 MG PO CAPS: 100.0000 mg | ORAL_CAPSULE | Freq: Three times a day (TID) | ORAL | 0 refills | Status: DC

## 2016-02-16 MED FILL — GABAPENTIN 100 MG CAPSULE: 100 | 20 days supply | Qty: 60 | Fill #0

## 2016-02-16 NOTE — Telephone Encounter (Signed)
Pt asked if he could be put on gabapentin instead of oxycodone.

## 2016-02-16 NOTE — Telephone Encounter (Signed)
Sent Rx to [facility]

## 2016-02-16 NOTE — Telephone Encounter (Signed)
Sure 100 mg tid. #60

## 2016-02-16 NOTE — Telephone Encounter (Signed)
Pt believes he has a uti from catheters that he has. Pt requesting antibiotic and a call back to discuss please.   Pt number is [phone]

## 2016-02-17 DIAGNOSIS — S32810D Multiple fractures of pelvis with stable disruption of pelvic ring, subsequent encounter for fracture with routine healing: Secondary | ICD-10-CM | POA: Diagnosis not present

## 2016-02-17 DIAGNOSIS — S52501D Unspecified fracture of the lower end of right radius, subsequent encounter for closed fracture with routine healing: Secondary | ICD-10-CM | POA: Diagnosis not present

## 2016-02-17 DIAGNOSIS — S52121D Displaced fracture of head of right radius, subsequent encounter for closed fracture with routine healing: Secondary | ICD-10-CM | POA: Diagnosis not present

## 2016-02-17 NOTE — Telephone Encounter (Signed)
Please advise 

## 2016-02-17 NOTE — Telephone Encounter (Signed)
I don't know how to treat UTIs. He needs to talk to urologist

## 2016-02-21 ENCOUNTER — Ambulatory Visit (INDEPENDENT_AMBULATORY_CARE_PROVIDER_SITE_OTHER): Payer: 59 | Admitting: Orthopaedic Surgery

## 2016-02-21 ENCOUNTER — Ambulatory Visit (INDEPENDENT_AMBULATORY_CARE_PROVIDER_SITE_OTHER): Payer: Self-pay

## 2016-02-21 ENCOUNTER — Encounter (INDEPENDENT_AMBULATORY_CARE_PROVIDER_SITE_OTHER): Payer: Self-pay | Admitting: Orthopaedic Surgery

## 2016-02-21 DIAGNOSIS — S52124D Nondisplaced fracture of head of right radius, subsequent encounter for closed fracture with routine healing: Secondary | ICD-10-CM | POA: Diagnosis not present

## 2016-02-21 NOTE — Progress Notes (Signed)
William Holt follows up today from his recent hospitalizations. He is actually doing quite well. He is ambulating with a walker but he really doesn't need it much. He does get tired pretty easily. His main discomfort comes from his elbow. He does still have discomfort with palpation over the radial head. Overall he is doing well. He is out of work. X-ray show stable radial head fracture that's minimally depressed. At this point he is doing well on then discontinue the walker. His gait should normalize as his strength improves. I'll like to see him back in 4 weeks anticipate releasing him at that point.

## 2016-02-22 ENCOUNTER — Ambulatory Visit: Payer: 59 | Attending: Orthopaedic Surgery

## 2016-02-22 DIAGNOSIS — M25621 Stiffness of right elbow, not elsewhere classified: Secondary | ICD-10-CM | POA: Diagnosis not present

## 2016-02-22 DIAGNOSIS — M6281 Muscle weakness (generalized): Secondary | ICD-10-CM | POA: Insufficient documentation

## 2016-02-22 DIAGNOSIS — M25521 Pain in right elbow: Secondary | ICD-10-CM | POA: Diagnosis not present

## 2016-02-22 NOTE — Telephone Encounter (Signed)
Called pt to ask if he still had problems states it went away on its own.

## 2016-02-22 NOTE — Telephone Encounter (Signed)
I'm sorry, I did not see where he had sent this back to me. I have not spoken with patient.

## 2016-02-22 NOTE — Therapy (Signed)
[facility] [address] [place], [place], [address] Phone: [phone]   Fax:  [phone]  Physical Therapy Evaluation  Patient Details  Name: William Holt MRN: [id number] Date of Birth: [birth year] Referring Provider: Eduard Roux, MD  Encounter Date: 02/22/2016      PT End of Session - 02/22/16 1602    Visit Number 1   Date for PT Re-Evaluation 04/18/16   PT Start Time [id number]   PT Stop Time 1610   PT Time Calculation (min) 39 min   Activity Tolerance Patient tolerated treatment well   Behavior During Therapy [facility] for tasks assessed/performed      Past Medical History:  Diagnosis Date  . Dyspepsia   . GERD (gastroesophageal reflux disease)   . IBS (irritable bowel syndrome)     Past Surgical History:  Procedure Laterality Date  . 2 other hand surgeries,2006, 2002    . ARTERY REPAIR Right 01/26/2014   Procedure: right thumb exploratio and repair artery;  Surgeon: Daryll Brod, MD;  Location: [place];  Service: Orthopedics;  Laterality: Right;  . HAND SURGERY     2002, 2006    There were no vitals filed for this visit.       Subjective Assessment - 02/22/16 1538    Subjective Pt presents to PT with Rt radial head fracture sustained during a fall (26 feet off of a roof). No surgery was performed.   Pt also sustained inuries to his pelvis and back.     Pertinent History fall 02/07/16 from a roof-sustained multiple fracture   Diagnostic tests x-ray: radius fracture, pubic fracture   Currently in Pain? Yes   Pain Score 4    Pain Location Elbow   Pain Orientation Right   Pain Type Acute pain   Pain Radiating Towards Rt hand   Pain Onset 1 to 4 weeks ago   Pain Frequency Intermittent   Aggravating Factors  extending elbow, use of hand, lifting anything   Pain Relieving Factors at rest            [facility] PT Assessment - 02/22/16 0001      Assessment   Medical Diagnosis closed  nondisplaced fracture of head of the Rt radius with routine healing   Referring Provider Eduard Roux, MD   Onset Date/Surgical Date 02/07/16   Hand Dominance Right   Next MD Visit 03/21/15   Prior Therapy home health PT-2 visits     Precautions   Precautions Other (comment)   Precaution Comments no lifting >20# per MD     Restrictions   Weight Bearing Restrictions No     Balance Screen   Has the patient fallen in the past 6 months No  off a roof, not while walking   Has the patient had a decrease in activity level because of a fear of falling?  No   Is the patient reluctant to leave their home because of a fear of falling?  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, restore classic cars     Cognition   Overall Cognitive Status Within Functional Limits for tasks assessed     Observation/Other Assessments   Focus on Therapeutic Outcomes (FOTO)  37% limitation     Posture/Postural Control  Posture/Postural Control Postural limitations   Postural Limitations Rounded Shoulders;Forward head     ROM / Strength   AROM / PROM / Strength AROM;PROM;Strength     AROM   Overall AROM  Deficits   Overall AROM Comments Lt elbow AROM 0-130   AROM Assessment Site Elbow   Right/Left Elbow Right   Right Elbow Flexion 130   Right Elbow Extension 6     Strength   Overall Strength Deficits   Strength Assessment Site Wrist;Elbow;Hand   Right/Left Elbow Right;Left   Right Elbow Flexion 4+/5   Right Elbow Extension 4/5   Left Elbow Flexion 5/5   Left Elbow Extension 5/5   Right/Left Wrist Right;Left   Right Wrist Flexion 4/5   Right Wrist Extension 4/5   Left Wrist Flexion 5/5   Left Wrist Extension 5/5   Right/Left hand Right;Left   Right Hand Grip (lbs) 94   Left Hand Grip  (lbs) 143     Palpation   Palpation comment palpable tenderness at the Rt radial head and proximal wrist extensor tendons     Ambulation/Gait   Ambulation/Gait Yes   Gait Comments antalgic gait due to pelvis and lumbar fracture.                     [facility] Adult PT Treatment/Exercise - 02/22/16 0001      Exercises   Exercises Elbow;Shoulder     Shoulder Exercises: ROM/Strengthening   UBE (Upper Arm Bike) Level 5 x 8 minutes (4/4)                PT Education - 02/22/16 1600    Education provided Yes   Education Details elbow and wrist flexion/extension, grip, biceps curls   Person(s) Educated Patient   Methods Explanation;Demonstration;Handout   Comprehension Verbalized understanding;Returned demonstration          PT Short Term Goals - 02/22/16 1620      PT SHORT TERM GOAL #1   Title be independent in initial HEP   Time 4   Period Weeks   Status New     PT SHORT TERM GOAL #2   Title improve Rt wrist and elbow strength to 5/5 to allow for normal daily use without limitation   Time 4   Period Weeks   Status New     PT SHORT TERM GOAL #3   Title report < or = to 3/10 Rt elbow strength with extension   Time 4   Period Weeks   Status New     PT SHORT TERM GOAL #4   Title demonstrate 105# grip on the Rt hand to improve use with daily tasks   Time 4   Period Weeks   Status New           PT Long Term Goals - 02/22/16 1638      PT LONG TERM GOAL #1   Title be independent in advanced HEP   Time 8   Period Weeks   Status New     PT LONG TERM GOAL #2   Title reduce FOTO to < or = to 22% limitation   Time 8   Period Weeks   Status New     PT LONG TERM GOAL #3   Title report no pain with elbow extension on the Rt   Time 8   Period Weeks   Status New     PT LONG TERM GOAL #4   Title demonstrate full Rt elbow  extension to improve use with work tasks   Time 8   Period Weeks   Status New     PT LONG TERM GOAL #5   Title  demonstrate Rt grip strength to > or = to 125# to improve use with work tasks   Time [place] - 02/22/16 1605    Clinical Impression Statement Pt is a Rt hand dominant male who presents to PT s/p Rt radial head fracture sustained from a fall off of a roof.  Pt also sustained pelvis and lumbar fracture per pt report and order is only for Rt elbow at this time.  Pt demonstrates reduced Rt elbow extension, weakness in Rt elbow, wrist and hand and palpable tenderness over Rt proximal wrist extensor tendons.  Pt is a low complexity evaluation due to lack of comorbidities and stable condition.  Pt will benefit from skilled PT for Rt elbow ROM, grip strength, and strength/endurance progression to allow for return to regular use of the Rt arm and return to work.     Rehab Potential Good   PT Frequency 2x / week   PT Duration 8 weeks   PT Treatment/Interventions ADLs/Self Care Home Management;Cryotherapy;Electrical Stimulation;Iontophoresis 4mg /ml Dexamethasone;Functional mobility training;Ultrasound;Moist Heat;Therapeutic activities;Therapeutic exercise;Neuromuscular re-education;Patient/family education;Passive range of motion;Manual techniques;Dry needling;Taping   PT Next Visit Plan work on Rt elbow extension, Rt wrist, elbow and hand strength, US/manual to Rt wrist extensors and hand   Consulted and Agree with Plan of Care Patient      Patient will benefit from skilled therapeutic intervention in order to improve the following deficits and impairments:  Postural dysfunction, Decreased strength, Decreased endurance, Decreased activity tolerance, Impaired flexibility, Pain, Impaired UE functional use  Visit Diagnosis: Pain in right elbow - Plan: PT plan of care cert/re-cert  Stiffness of right elbow, not elsewhere classified - Plan: PT plan of care cert/re-cert  Muscle weakness (generalized) - Plan: PT plan of care cert/re-cert     Problem  List Patient Active Problem List   Diagnosis Date Noted  . Fall from roof 02/08/2016  . Concussion 02/08/2016  . Skull fracture ([place]) 02/08/2016  . Right orbit fracture ([place]) 02/08/2016  . Left wrist fracture 02/08/2016  . Closed T1 fracture ([place]) 02/08/2016  . Multiple pelvic fractures ([place]) 02/08/2016  . Urinary retention 02/08/2016  . Closed displaced fracture of head of right radius   . Closed displaced fracture of pelvis ([place])   . Closed fracture of left wrist   . TBI (traumatic brain injury) ([place]) 02/07/2016  . Nonallopathic lesion of thoracic region 08/01/2015  . Nonallopathic lesion of cervical region 08/01/2015  . Nonallopathic lesion of lumbosacral region 08/01/2015  . Scapular dysfunction 08/01/2015  . PILONIDAL CYST 10/18/2009  . BACK PAIN, THORACIC REGION 10/18/2009  . FOOT PAIN, RIGHT 10/18/2009  . B12 DEFICIENCY 10/11/2009  . ALLERGIC RHINITIS 10/10/2009  . FATIGUE 10/10/2009  . PENILE LESION 10/07/2008  . SEBACEOUS CYST, INFECTED 10/07/2008     Sigurd Sos, PT 02/22/16 4:42 PM  [facility] [address] [place], [place], [address] Phone: [phone]   Fax:  [phone]  Name: William Holt MRN: [id number] Date of Birth: 03-30-1985

## 2016-02-22 NOTE — Patient Instructions (Addendum)
AROM: Elbow Flexion / Extension   With left hand palm up, gently bend elbow as far as possible. Then straighten arm as far as possible. Repeat _10___ times per set. Do ____ sets per session. Do __3__ sessions per day.  Copyright  VHI. All rights reserved.  Wrist Flexor Stretch   Keeping elbow straight, grasp left hand and slowly bend wrist back until stretch is felt. Hold __10-20__ seconds. Relax. Repeat _3___ times per set. Do ____ sets per session. Do __3__ sessions per day.  Copyright  VHI. All rights reserved.  Wrist Extensor Stretch   Keeping elbow straight, grasp left hand and slowly bend wrist forward until stretch is felt. Hold __10-20__ seconds. Relax. Repeat __3__ times per set. Do ____ sets per session. Do __3__ sessions per day.  Copyright  VHI. All rights reserved.    Squeeze your hand using a ball  Elbow Extension    Place right arm, holding a ____ lb weight, on pillow, hand beyond pillow. Let weight straighten elbow as wrist moves toward floor. Hold ____ seconds. Repeat ____ times. Do ____ sessions per day.  http://gt2.exer.us/191   Copyright  VHI. All rights reserved.  [place] [address] [place], [place] [address] Phone # [phone] Fax [phone]

## 2016-02-27 ENCOUNTER — Ambulatory Visit: Payer: 59

## 2016-02-27 DIAGNOSIS — M6281 Muscle weakness (generalized): Secondary | ICD-10-CM

## 2016-02-27 DIAGNOSIS — M25521 Pain in right elbow: Secondary | ICD-10-CM

## 2016-02-27 DIAGNOSIS — M25621 Stiffness of right elbow, not elsewhere classified: Secondary | ICD-10-CM | POA: Diagnosis not present

## 2016-02-27 NOTE — Therapy (Signed)
[facility] [address] [place], [place], [address] Phone: [phone]   Fax:  [phone]  Physical Therapy Treatment  Patient Details  Name: William Holt MRN: [id number] Date of Birth: 1985-03-27 Referring Provider: Eduard Roux, MD  Encounter Date: 02/27/2016      William Holt End of Session - 02/27/16 1319    Visit Number 2   Date for William Holt Re-Evaluation 04/18/16   William Holt Start Time 1233   William Holt Stop Time 1313   William Holt Time Calculation (min) 40 min   Activity Tolerance Patient tolerated treatment well   Behavior During Therapy [facility] for tasks assessed/performed      Past Medical History:  Diagnosis Date  . Dyspepsia   . GERD (gastroesophageal reflux disease)   . IBS (irritable bowel syndrome)     Past Surgical History:  Procedure Laterality Date  . 2 other hand surgeries,2006, 2002    . ARTERY REPAIR Right 01/26/2014   Procedure: right thumb exploratio and repair artery;  Surgeon: Daryll Brod, MD;  Location: [place];  Service: Orthopedics;  Laterality: Right;  . HAND SURGERY     2002, 2006    There were no vitals filed for this visit.      Subjective Assessment - 02/27/16 1240    Subjective Doing OK with exercises.     Diagnostic tests x-ray: radius fracture, pubic fracture   Currently in Pain? Yes   Pain Score 2    Pain Location Elbow   Pain Orientation Right   Pain Descriptors / Indicators Aching   Pain Type Acute pain   Pain Onset 1 to 4 weeks ago   Pain Frequency Intermittent   Aggravating Factors  extensing elbow, use of of hand, lifting anything   Pain Relieving Factors at rest                         [facility] Adult William Holt Treatment/Exercise - 02/27/16 0001      Exercises   Exercises Hand     Elbow Exercises   Elbow Flexion AAROM;Right;20 reps   Elbow Extension AAROM;Right;20 reps   Other elbow exercises velcro board: supination/pronation, and wrist felxion/extension x 5 each  using wide strip     Shoulder Exercises: ROM/Strengthening   UBE (Upper Arm Bike) Level 5 x 8 minutes (4/4)     Hand Exercises   Digiticizer green Rt hand x 3 minutes     Modalities   Modalities Ultrasound     Ultrasound   Ultrasound Location Rt wrist extensors distally   Ultrasound Parameters 1.0 w/cm2 50% pulsed x 6 minutes   Ultrasound Goals Pain     Manual Therapy   Manual Therapy Soft tissue mobilization;Passive ROM   Manual therapy comments to Rt wrist and hand with P/ROM and tissue mobilzation at distal forearm.                    William Holt Short Term Goals - 02/27/16 1242      William Holt SHORT TERM GOAL #1   Title be independent in initial HEP   Time 4   Period Weeks   Status On-going     William Holt SHORT TERM GOAL #2   Title improve Rt wrist and elbow strength to 5/5 to allow for normal daily use without limitation   Time 4   Period Weeks   Status On-going     William Holt SHORT TERM GOAL #3   Title report <  or = to 3/10 Rt elbow strength with extension   Time 4   Period Weeks   Status On-going           William Holt Long Term Goals - 02/22/16 1638      William Holt LONG TERM GOAL #1   Title be independent in advanced HEP   Time 8   Period Weeks   Status New     William Holt LONG TERM GOAL #2   Title reduce FOTO to < or = to 22% limitation   Time 8   Period Weeks   Status New     William Holt LONG TERM GOAL #3   Title report no pain with elbow extension on the Rt   Time 8   Period Weeks   Status New     William Holt LONG TERM GOAL #4   Title demonstrate full Rt elbow extension to improve use with work tasks   Time 8   Period Weeks   Status New     William Holt LONG TERM GOAL #5   Title demonstrate Rt grip strength to > or = to 125# to improve use with work tasks   Time 8   Period Weeks   Status New               Plan - 02/27/16 1255    Clinical Impression Statement William Holt with continued Rt wrist and hand pain.  Limited Rt elbow and wrist A/ROM s/p fracture and painful hand/wrist motion.  William Holt able tolerate  all gentle resisted exercise today.  William Holt will continue to benefit from skilled William Holt for Rt hand/wrist and elbow strength and A/ROM to improve use and allow for return to work.     Rehab Potential Good   William Holt Frequency 2x / week   William Holt Duration 8 weeks   William Holt Treatment/Interventions ADLs/Self Care Home Management;Cryotherapy;Electrical Stimulation;Iontophoresis 4mg /ml Dexamethasone;Functional mobility training;Ultrasound;Moist Heat;Therapeutic activities;Therapeutic exercise;Neuromuscular re-education;Patient/family education;Passive range of motion;Manual techniques;Dry needling;Taping   William Holt Next Visit Plan work on Rt elbow extension, Rt wrist, elbow and hand strength, US/manual to Rt wrist flexor/ extensors and hand   Consulted and Agree with Plan of Care Patient      Patient will benefit from skilled therapeutic intervention in order to improve the following deficits and impairments:  Postural dysfunction, Decreased strength, Decreased endurance, Decreased activity tolerance, Impaired flexibility, Pain, Impaired UE functional use  Visit Diagnosis: Pain in right elbow  Stiffness of right elbow, not elsewhere classified  Muscle weakness (generalized)     Problem List Patient Active Problem List   Diagnosis Date Noted  . Fall from roof 02/08/2016  . Concussion 02/08/2016  . Skull fracture ([place]) 02/08/2016  . Right orbit fracture ([place]) 02/08/2016  . Left wrist fracture 02/08/2016  . Closed T1 fracture ([place]) 02/08/2016  . Multiple pelvic fractures ([place]) 02/08/2016  . Urinary retention 02/08/2016  . Closed displaced fracture of head of right radius   . Closed displaced fracture of pelvis ([place])   . Closed fracture of left wrist   . TBI (traumatic brain injury) ([place]) 02/07/2016  . Nonallopathic lesion of thoracic region 08/01/2015  . Nonallopathic lesion of cervical region 08/01/2015  . Nonallopathic lesion of lumbosacral region 08/01/2015  . Scapular dysfunction 08/01/2015  . PILONIDAL CYST  10/18/2009  . BACK PAIN, THORACIC REGION 10/18/2009  . FOOT PAIN, RIGHT 10/18/2009  . B12 DEFICIENCY 10/11/2009  . ALLERGIC RHINITIS 10/10/2009  . FATIGUE 10/10/2009  . PENILE LESION 10/07/2008  . SEBACEOUS CYST, INFECTED 10/07/2008  William Holt, William Holt 02/27/16 1:21 PM  [facility] [address], [place], [place], [address] Phone: [phone]   Fax:  [phone]  Name: William Holt MRN: [id number] Date of Birth: [birth year]

## 2016-02-29 ENCOUNTER — Ambulatory Visit: Payer: 59

## 2016-02-29 DIAGNOSIS — M6281 Muscle weakness (generalized): Secondary | ICD-10-CM

## 2016-02-29 DIAGNOSIS — M25621 Stiffness of right elbow, not elsewhere classified: Secondary | ICD-10-CM

## 2016-02-29 DIAGNOSIS — M25521 Pain in right elbow: Secondary | ICD-10-CM

## 2016-02-29 NOTE — Therapy (Addendum)
[facility] [address], [place], [place], [address] Phone: [phone]   Fax:  [phone]  Physical Therapy Treatment  Patient Details  Name: William Holt MRN: [id number] Date of Birth: 31-May-1985 Referring Provider: Eduard Roux, MD  Encounter Date: 02/29/2016      PT End of Session - 02/29/16 1211    Visit Number 3   Date for PT Re-Evaluation 04/18/16   PT Start Time 1143   PT Stop Time 1224   PT Time Calculation (min) 41 min   Activity Tolerance Patient tolerated treatment well   Behavior During Therapy [facility] for tasks assessed/performed      Past Medical History:  Diagnosis Date  . Dyspepsia   . GERD (gastroesophageal reflux disease)   . IBS (irritable bowel syndrome)     Past Surgical History:  Procedure Laterality Date  . 2 other hand surgeries,2006, 2002    . ARTERY REPAIR Right 01/26/2014   Procedure: right thumb exploratio and repair artery;  Surgeon: Daryll Brod, MD;  Location: [place];  Service: Orthopedics;  Laterality: Right;  . HAND SURGERY     2002, 2006    There were no vitals filed for this visit.      Subjective Assessment - 02/29/16 1143    Subjective I felt better after last session.  My Rt hand felt better.     Pertinent History fall 02/07/16 from a roof-sustained multiple fractures   Diagnostic tests x-ray: radius fracture, pubic fracture   Currently in Pain? Yes   Pain Score 2    Pain Location Elbow   Pain Orientation Right   Pain Descriptors / Indicators Aching;Sore   Pain Type Acute pain   Pain Onset 1 to 4 weeks ago   Pain Frequency Intermittent            [facility] PT Assessment - 02/29/16 0001      Strength   Right Hand Grip (lbs) 104                     [facility] Adult PT Treatment/Exercise - 02/29/16 0001      Elbow Exercises   Elbow Flexion AAROM;Right;20 reps   Elbow Extension AAROM;Right;20 reps   Elbow Extension Limitations  triceps press down with pulley machine: 25# 2x10 with emphasis on end range extension and triceps contraction   Other elbow exercises velcro board: supination/pronation, and wrist felxion/extension x 5 each using wide strip     Shoulder Exercises: Pulleys   Flexion 3 minutes     Shoulder Exercises: ROM/Strengthening   UBE (Upper Arm Bike) Level 2 x 8 minutes (4/4)  PT present to discuss progress     Hand Exercises   Digiticizer green Rt hand x 3 minutes     Manual Therapy   Manual Therapy Soft tissue mobilization;Passive ROM   Manual therapy comments to Rt wrist and hand with P/ROM and tissue mobilzation at distal forearm.  Rt elbow P/ROM and joint mobs to improve extension                  PT Short Term Goals - 02/27/16 1242      PT SHORT TERM GOAL #1   Title be independent in initial HEP   Time 4   Period Weeks   Status On-going     PT SHORT TERM GOAL #2   Title improve Rt wrist and elbow strength to 5/5 to allow for normal daily use without  limitation   Time 4   Period Weeks   Status On-going     PT SHORT TERM GOAL #3   Title report < or = to 3/10 Rt elbow strength with extension   Time 4   Period Weeks   Status On-going           PT Long Term Goals - 02/22/16 1638      PT LONG TERM GOAL #1   Title be independent in advanced HEP   Time 8   Period Weeks   Status New     PT LONG TERM GOAL #2   Title reduce FOTO to < or = to 22% limitation   Time 8   Period Weeks   Status New     PT LONG TERM GOAL #3   Title report no pain with elbow extension on the Rt   Time 8   Period Weeks   Status New     PT LONG TERM GOAL #4   Title demonstrate full Rt elbow extension to improve use with work tasks   Time 8   Period Weeks   Status New     PT LONG TERM GOAL #5   Title demonstrate Rt grip strength to > or = to 125# to improve use with work tasks   Time 8   Period Weeks   Status New               Plan - 02/29/16 1200    Clinical  Impression Statement Pt continues to report Rt hand pain at the palm and intrinsics.  Pt with improved Rt grip today to 104#.  Pt tolerated gentle resisted exercise today and responded well to manual therapy to elbow and hand on the Rt.  Pt will continue to benefit from skilled PT for Rt elbow and hand strength, ROM and manual therapy to allow for return to use with work tasks.     Rehab Potential Good   PT Frequency 2x / week   PT Duration 8 weeks   PT Treatment/Interventions ADLs/Self Care Home Management;Cryotherapy;Electrical Stimulation;Iontophoresis 30m/ml Dexamethasone;Functional mobility training;Ultrasound;Moist Heat;Therapeutic activities;Therapeutic exercise;Neuromuscular re-education;Patient/family education;Passive range of motion;Manual techniques;Dry needling;Taping   PT Next Visit Plan work on Rt elbow extension, Rt wrist, elbow and hand strength, manual to Rt wrist flexor/ extensors and hand.  Measure Rt elbow extension.    Consulted and Agree with Plan of Care Patient      Patient will benefit from skilled therapeutic intervention in order to improve the following deficits and impairments:  Postural dysfunction, Decreased strength, Decreased endurance, Decreased activity tolerance, Impaired flexibility, Pain, Impaired UE functional use  Visit Diagnosis: Pain in right elbow  Stiffness of right elbow, not elsewhere classified  Muscle weakness (generalized)     Problem List Patient Active Problem List   Diagnosis Date Noted  . Fall from roof 02/08/2016  . Concussion 02/08/2016  . Skull fracture ([place] 02/08/2016  . Right orbit fracture ([place] 02/08/2016  . Left wrist fracture 02/08/2016  . Closed T1 fracture ([place] 02/08/2016  . Multiple pelvic fractures ([place] 02/08/2016  . Urinary retention 02/08/2016  . Closed displaced fracture of head of right radius   . Cl[place] displaced fracture of pelvis ([place]   . Closed fracture of left wrist   . TBI (traumatic brain injury)  ([place] 02/07/2016  . Nonallopathic lesion of thoracic region 08/01/2015  . Nonallopathic lesion of cervical region 08/01/2015  . Nonallopathic lesion of lumbosacral region 08/01/2015  . Scapular dysfunction 08/01/2015  .  PILONIDAL CYST 10/18/2009  . BACK PAIN, THORACIC REGION 10/18/2009  . FOOT PAIN, RIGHT 10/18/2009  . B12 DEFICIENCY 10/11/2009  . ALLERGIC RHINITIS 10/10/2009  . FATIGUE 10/10/2009  . PENILE LESION 10/07/2008  . SEBACEOUS CYST, INFECTED 10/07/2008     Sigurd Sos, PT 02/29/16 12:25 PM PHYSICAL THERAPY DISCHARGE SUMMARY  Visits from Start of Care: 3  Current functional level related to goals / functional outcomes: Pt didn't return to PT.     Remaining deficits: See above for most current status.     Education / Equipment: HEP Plan: Patient agrees to discharge.  Patient goals were not met. Patient is being discharged due to not returning since the last visit.  ?????        Sigurd Sos, PT 04/04/16 2:11 PM  [facility] [address] [place], [place], [address] Phone: [phone]   Fax:  [phone]  Name: William Holt MRN: [id number] Date of Birth: March 01, 1985

## 2016-03-05 ENCOUNTER — Encounter: Payer: Self-pay | Admitting: Physical Therapy

## 2016-03-06 NOTE — Discharge Summary (Signed)
[facility] Discharge Summary   Patient ID: William Holt MRN: [id number] DOB/AGE: 1985-03-01 31 y.o.  Admit date: 02/07/2016 Discharge date: 03/06/2016  Admitting Diagnosis: Fall from roof Concussion Frontal skull fracture Right orbit fracture Right radial head fracture Closed T1 fracture Multiple pelvic fractures  Discharge Diagnosis Patient Active Problem List   Diagnosis Date Noted  . Fall from roof 02/08/2016  . Concussion 02/08/2016  . Skull fracture ([place]) 02/08/2016  . Right orbit fracture ([place]) 02/08/2016  . Left wrist fracture 02/08/2016  . Closed T1 fracture ([place]) 02/08/2016  . Multiple pelvic fractures ([place]) 02/08/2016  . Urinary retention 02/08/2016  . Closed displaced fracture of head of right radius   . Closed displaced fracture of pelvis ([place])   . Closed fracture of left wrist   . TBI (traumatic brain injury) ([place]) 02/07/2016  . Nonallopathic lesion of thoracic region 08/01/2015  . Nonallopathic lesion of cervical region 08/01/2015  . Nonallopathic lesion of lumbosacral region 08/01/2015  . Scapular dysfunction 08/01/2015  . PILONIDAL CYST 10/18/2009  . BACK PAIN, THORACIC REGION 10/18/2009  . FOOT PAIN, RIGHT 10/18/2009  . B12 DEFICIENCY 10/11/2009  . ALLERGIC RHINITIS 10/10/2009  . FATIGUE 10/10/2009  . PENILE LESION 10/07/2008  . SEBACEOUS CYST, INFECTED 10/07/2008    Consultants Otolaryngology - Melida Quitter, MD  Orthopedic surgery - [place] Ephriam Jenkins, MD Physical Medicine and Rehabilitation - Charlett Blake, MD [place], [place] Neurosurgery - Kathyrn Sheriff, MD  Imaging: 02/07/16: CT HEAD/C-SPINE WO CONTRAST - Fractures of the right nasal bone, right orbital roof and right frontal bone. Negative for acute intracranial abnormality.  Mild superior endplate compression fracture of T1. No involvement of the posterior elements or central canal compromise is identified.  02/07/16: CT CHEST W CONTRAST - No vascular  lesion evident. No fracture or dislocation. Slight bibasilar atelectasis without parenchymal lung contusion or pneumothorax. No edema or consolidation. No adenopathy.  02/07/16: CT ABD/PELV W CONTRAST - Comminuted fracture right sacral ala. Fracture along the superior left pubic symphysis in near anatomic alignment. Fractures of each anterior acetabular region without fracture fragments extending into the respective hip joints.  Visceral structures appear intact without laceration or rupture. No bowel wall thickening or bowel obstruction. No abscess. No abnormal fluid collections. Appendix appears normal. No renal or ureteral calculi. No hydronephrosis. There are a few tiny prostatic calculi. No vascular lesion evident.  02/07/16: DG Forearm Right - angulated displaced fracture of the radial head  02/07/16: DG Elbow complete Right - Minimally displaced radial head fracture with intra-articular extension.     Procedures   Hospital Course:   31 year-old male who presented to [facility] after a fall from a roof while working.  CT significant for TBI/concussion, right orbit and frontal skull fracture, T1 fracture, right radial heal fracture, and multiple pelvic fractures.  Patient was hemodynamically stable, without intraabdominal pathology, and was admitted to the stepdown unit for orthopedic surgery, neurosurgery, and ENT consults.  Neurosurgery determined that frontal skull fractures and T1 fractures did not require surgical treatment.  ENT determined that orbital roof.frontal bone fracture and nasal fracture were non-displaced and neither required operative intervention.  Orthopedic surgery planned to treat all fractures (radial head, left wrist?, pelvic ring, acetabular) nonoperatively with follow-up in 2 weeks. (LUE WBAT in wrist brace, RUE platform weight bear w/ sling, BLE WBAT).   Physical rehab consulted the patient and determined that with strong family support he would be stable for  discharge home with home  health. Patient did experience some urinary retention during his hospitalization which resolved with a decrease in narcotic medications.  On Hospital day #2 the patient was voiding well, tolerating diet, working with therapies, pain well controlled, vital signs stable, and felt stable for discharge home.  Patient will follow up with orthopedic surgery in two weeks, and knows to call with questions or concerns.    Allergies as of 02/10/2016   No Known Allergies     Medication List    TAKE these medications   multivitamin with minerals Tabs tablet Take 1 tablet by mouth daily.   oxyCODONE 5 MG immediate release tablet Commonly known as:  Oxy IR/ROXICODONE Take 1-2 tablets (5-10 mg total) by mouth every 4 (four) hours as needed (5mg  for mild pain, 10mg  for moderate pain, 15mg  for severe pain).   promethazine 12.5 MG tablet Commonly known as:  PHENERGAN Take 1-2 tablets (12.5-25 mg total) by mouth every 6 (six) hours as needed for nausea.   ranitidine 150 MG tablet Commonly known as:  ZANTAC Take 150 mg by mouth at bedtime.   traMADol 50 MG tablet Commonly known as:  ULTRAM Take 2 tablets (100 mg total) by mouth every 6 (six) hours.     ASK your doctor about these medications   bethanechol 25 MG tablet Commonly known as:  URECHOLINE Take 1 tablet (25 mg total) by mouth 4 (four) times daily. Ask about: Should I take this medication?   tamsulosin 0.4 MG Caps capsule Commonly known as:  FLOMAX Take 1 capsule (0.4 mg total) by mouth daily. Ask about: Should I take this medication?            Durable Medical Equipment        Start     Ordered   02/10/16 0000  For home use only DME 4 wheeled rolling walker with seat    Question:  Patient needs a walker to treat with the following condition  Answer:  Trauma   02/10/16 1623   02/10/16 0000  For home use only DME 3 n 1     02/10/16 1625       Follow-up Information    Eduard Roux, MD Follow up  in 2 week(s).   Specialty:  Orthopedic Surgery Why:  call to confirm appointment date/time Contact information: [place] [address] [phone]        [place]. Call.   Why:  as needed Contact information: [place] [phone] [place] Follow up.   Why:  Physical  therapist to follow up with you at home Contact information: [address] [place] [address] [phone]          Signed: Obie Dredge, [facility] 03/06/2016, 3:25 PM Pager: [phone] Consults: [phone] Mon-Fri 7:00 am-4:30 pm Sat-Sun 7:00 am-11:30 am

## 2016-03-20 ENCOUNTER — Ambulatory Visit (INDEPENDENT_AMBULATORY_CARE_PROVIDER_SITE_OTHER): Payer: 59 | Admitting: Orthopaedic Surgery

## 2016-03-20 ENCOUNTER — Encounter (INDEPENDENT_AMBULATORY_CARE_PROVIDER_SITE_OTHER): Payer: Self-pay | Admitting: Orthopaedic Surgery

## 2016-03-20 ENCOUNTER — Ambulatory Visit (INDEPENDENT_AMBULATORY_CARE_PROVIDER_SITE_OTHER): Payer: 59

## 2016-03-20 DIAGNOSIS — S52124D Nondisplaced fracture of head of right radius, subsequent encounter for closed fracture with routine healing: Secondary | ICD-10-CM | POA: Diagnosis not present

## 2016-03-20 DIAGNOSIS — M79641 Pain in right hand: Secondary | ICD-10-CM

## 2016-03-20 NOTE — Progress Notes (Signed)
Office Visit Note   Patient: William Holt           Date of Birth: 06/24/1985           MRN: [id number] Visit Date: 03/20/2016              Requested by: Ann Held, DO [address] [place], [place] [address] PCP: Ann Held, DO   Assessment & Plan: Visit Diagnoses:  1. Closed nondisplaced fracture of head of right radius with routine healing, subsequent encounter   2. Pain of right hand     Plan: X-rays are negative for scaphoid fracture and acute injury. He likely sprained his right thumb. He is going to continue to work on getting his extension better but he understands that he may not get full extension of all likely not get full extension. At this point I will see him back as needed.  Follow-Up Instructions: Return if symptoms worsen or fail to improve.   Orders:  Orders Placed This Encounter  Procedures  . XR Hand Complete Right   No orders of the defined types were placed in this encounter.     Procedures: No procedures performed   Clinical Data: No additional findings.   Subjective: Chief Complaint  Patient presents with  . Left Wrist - Follow-up  . Right Elbow - Follow-up  . Follow-up    Jelani follows up today for his radial head fracture and right wrist pain. He mainly complains of inability to fully extend his elbow. He has some achy discomfort in his elbow. Radial side of his right wrist is tender to palpation.    Review of Systems   Objective: Vital Signs: There were no vitals taken for this visit.  Physical Exam  Ortho Exam Exam of right elbow shows 25 of extension and near full flexion. He has full pronation and supination. Exam of the right hand shows no significant swelling. He does have tenderness in the anatomic spot box. Specialty Comments:  No specialty comments available.  Imaging: No results found.   PMFS History: Patient Active Problem List   Diagnosis Date Noted  . Fall from roof  02/08/2016  . Concussion 02/08/2016  . Skull fracture ([place]) 02/08/2016  . Right orbit fracture ([place]) 02/08/2016  . Left wrist fracture 02/08/2016  . Closed T1 fracture ([place]) 02/08/2016  . Multiple pelvic fractures ([place]) 02/08/2016  . Urinary retention 02/08/2016  . Closed displaced fracture of head of right radius   . Closed displaced fracture of pelvis ([place])   . Closed fracture of left wrist   . TBI (traumatic brain injury) ([place]) 02/07/2016  . Nonallopathic lesion of thoracic region 08/01/2015  . Nonallopathic lesion of cervical region 08/01/2015  . Nonallopathic lesion of lumbosacral region 08/01/2015  . Scapular dysfunction 08/01/2015  . PILONIDAL CYST 10/18/2009  . BACK PAIN, THORACIC REGION 10/18/2009  . FOOT PAIN, RIGHT 10/18/2009  . B12 DEFICIENCY 10/11/2009  . ALLERGIC RHINITIS 10/10/2009  . FATIGUE 10/10/2009  . PENILE LESION 10/07/2008  . SEBACEOUS CYST, INFECTED 10/07/2008   Past Medical History:  Diagnosis Date  . Dyspepsia   . GERD (gastroesophageal reflux disease)   . IBS (irritable bowel syndrome)     Family History  Problem Relation Age of Onset  . Deep vein thrombosis Mother   . Prostate cancer Father   . Deep vein thrombosis Sister     Past Surgical History:  Procedure Laterality Date  . 2 other  hand surgeries,2006, 2002    . ARTERY REPAIR Right 01/26/2014   Procedure: right thumb exploratio and repair artery;  Surgeon: Daryll Brod, MD;  Location: [facility];  Service: Orthopedics;  Laterality: Right;  . HAND SURGERY     2002, 2006   Social History   Occupational History  . Not on file.   Social History Main Topics  . Smoking status: Never Smoker  . Smokeless tobacco: Never Used  . Alcohol use No     Comment: social  . Drug use: No  . Sexual activity: Not on file

## 2016-04-12 DIAGNOSIS — S52124A Nondisplaced fracture of head of right radius, initial encounter for closed fracture: Secondary | ICD-10-CM | POA: Diagnosis not present

## 2016-04-12 DIAGNOSIS — S62211A Bennett's fracture, right hand, initial encounter for closed fracture: Secondary | ICD-10-CM | POA: Diagnosis not present

## 2016-05-15 DIAGNOSIS — S52124A Nondisplaced fracture of head of right radius, initial encounter for closed fracture: Secondary | ICD-10-CM | POA: Diagnosis not present

## 2016-05-15 DIAGNOSIS — S62211A Bennett's fracture, right hand, initial encounter for closed fracture: Secondary | ICD-10-CM | POA: Diagnosis not present

## 2016-06-04 DIAGNOSIS — S52124A Nondisplaced fracture of head of right radius, initial encounter for closed fracture: Secondary | ICD-10-CM | POA: Diagnosis not present

## 2016-06-04 DIAGNOSIS — S62211A Bennett's fracture, right hand, initial encounter for closed fracture: Secondary | ICD-10-CM | POA: Diagnosis not present

## 2016-08-08 DIAGNOSIS — F411 Generalized anxiety disorder: Secondary | ICD-10-CM | POA: Diagnosis not present

## 2016-08-08 DIAGNOSIS — K589 Irritable bowel syndrome without diarrhea: Secondary | ICD-10-CM | POA: Diagnosis not present

## 2016-08-08 DIAGNOSIS — R5381 Other malaise: Secondary | ICD-10-CM | POA: Diagnosis not present

## 2016-08-08 DIAGNOSIS — Z6827 Body mass index (BMI) 27.0-27.9, adult: Secondary | ICD-10-CM | POA: Diagnosis not present

## 2016-08-08 DIAGNOSIS — Z0000 Encounter for general adult medical examination without abnormal findings: Secondary | ICD-10-CM | POA: Diagnosis not present

## 2016-08-08 DIAGNOSIS — Z1389 Encounter for screening for other disorder: Secondary | ICD-10-CM | POA: Diagnosis not present

## 2016-08-08 MED FILL — SERTRALINE HCL 50 MG TABLET: 50 | 90 days supply | Qty: 90 | Fill #0

## 2016-11-15 DIAGNOSIS — H6123 Impacted cerumen, bilateral: Secondary | ICD-10-CM | POA: Diagnosis not present

## 2016-12-21 DIAGNOSIS — Z23 Encounter for immunization: Secondary | ICD-10-CM | POA: Diagnosis not present

## 2017-01-15 MED FILL — SERTRALINE HCL 50 MG TABLET: 50 | 90 days supply | Qty: 90 | Fill #1

## 2017-03-07 MED FILL — AZITHROMYCIN 250 MG TABLET: 250 | 5 days supply | Qty: 6 | Fill #0

## 2017-07-19 MED FILL — SERTRALINE HCL 50 MG TABLET: 50 | 90 days supply | Qty: 90 | Fill #2

## 2017-07-22 DIAGNOSIS — Z0000 Encounter for general adult medical examination without abnormal findings: Secondary | ICD-10-CM | POA: Diagnosis not present

## 2017-07-22 DIAGNOSIS — F411 Generalized anxiety disorder: Secondary | ICD-10-CM | POA: Diagnosis not present

## 2017-07-22 DIAGNOSIS — R82998 Other abnormal findings in urine: Secondary | ICD-10-CM | POA: Diagnosis not present

## 2017-07-22 DIAGNOSIS — K589 Irritable bowel syndrome without diarrhea: Secondary | ICD-10-CM | POA: Diagnosis not present

## 2017-07-22 DIAGNOSIS — E7841 Elevated Lipoprotein(a): Secondary | ICD-10-CM | POA: Diagnosis not present

## 2017-07-22 DIAGNOSIS — Z6828 Body mass index (BMI) 28.0-28.9, adult: Secondary | ICD-10-CM | POA: Diagnosis not present

## 2017-07-22 DIAGNOSIS — Z1389 Encounter for screening for other disorder: Secondary | ICD-10-CM | POA: Diagnosis not present

## 2017-07-22 DIAGNOSIS — R05 Cough: Secondary | ICD-10-CM | POA: Diagnosis not present

## 2017-12-10 MED FILL — SERTRALINE HCL 50 MG TABLET: 50 | 90 days supply | Qty: 90 | Fill #0

## 2018-02-06 MED FILL — VENTOLIN HFA 90 MCG INHALER: 108 (90 BAS | 25 days supply | Qty: 18 | Fill #0

## 2018-02-15 IMAGING — DX DG FOREARM 2V*R*
3 series · 3 of 3 positions shown · non-contrast
Comparison: No prior.

CLINICAL DATA: Fall.

EXAM:
RIGHT FOREARM - 2 VIEW

[forearm ap]
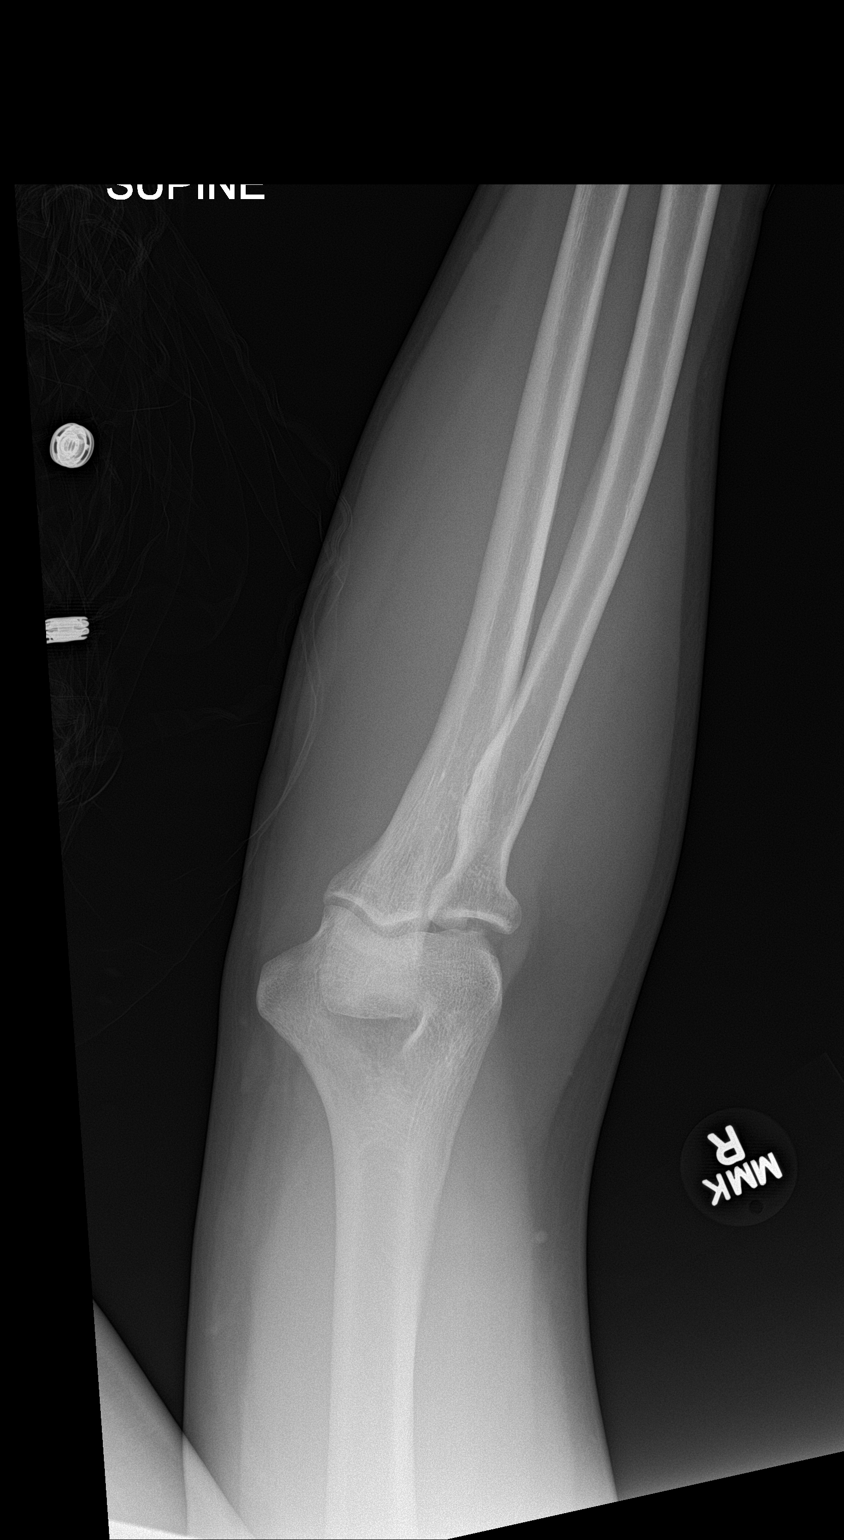

[forearm lat (1 of 2)]
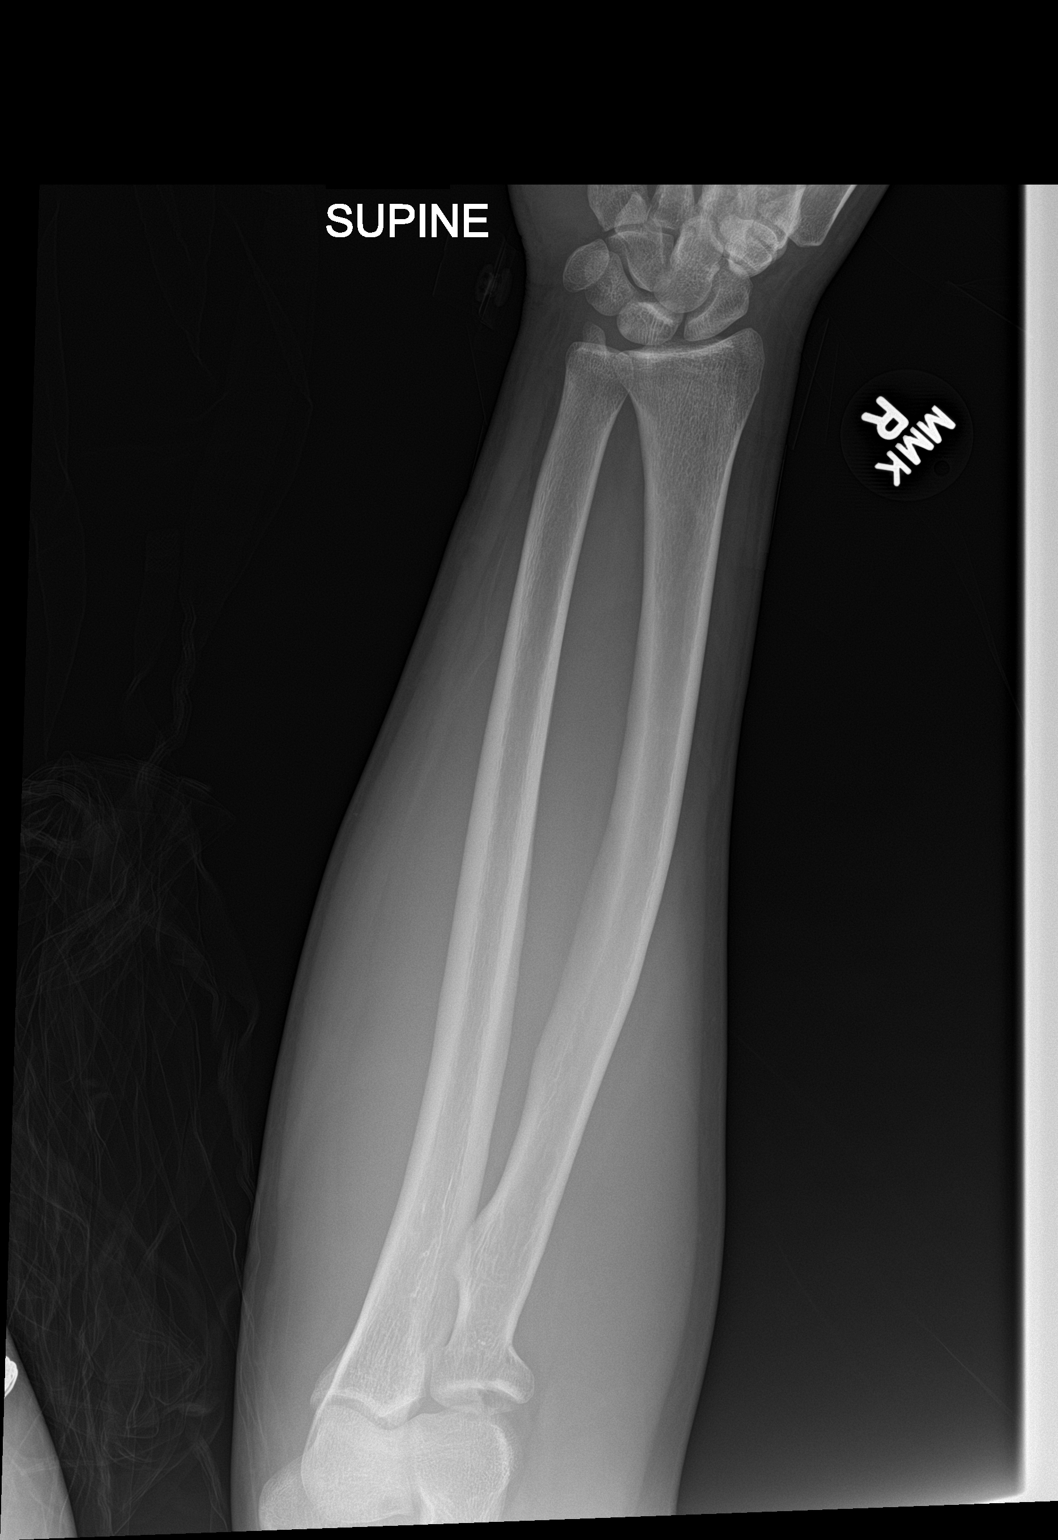

[forearm lat (2 of 2)]
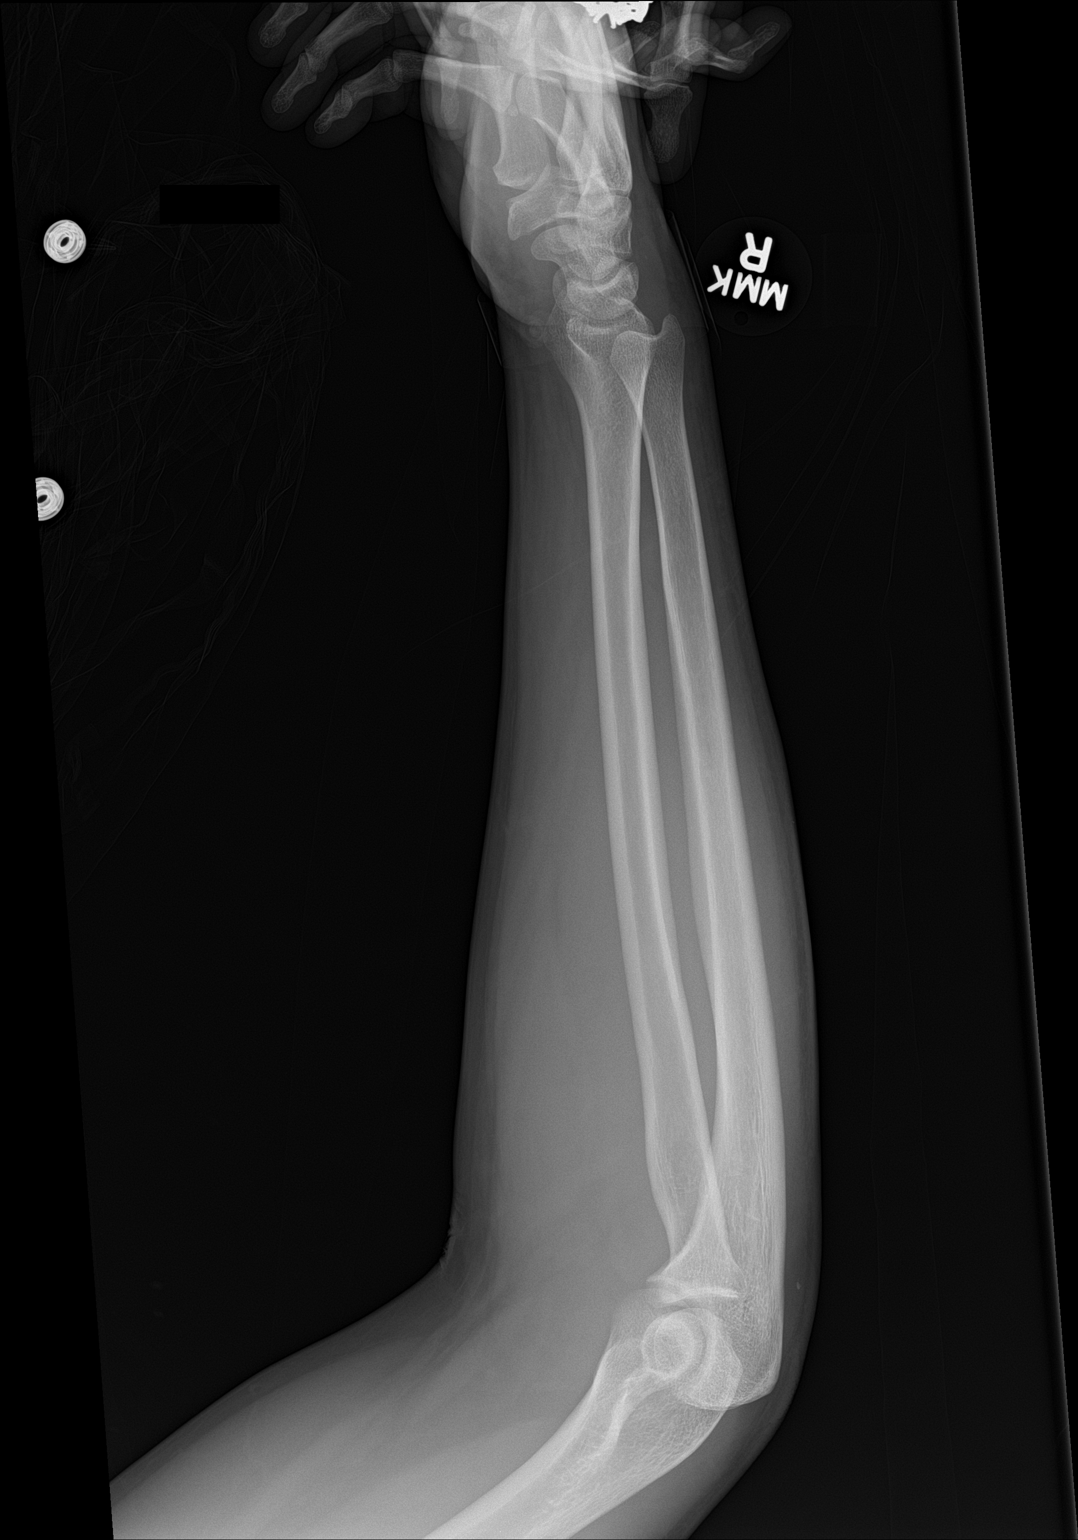

[3 of 3 positions shown; findings below may reference images not displayed]

FINDINGS: Angulated displaced fracture of the radial head is noted. Fracture
extends into the radiocarpal joint space. No other focal abnormality
identified.
IMPRESSION: Angulated displaced fracture of the radial head.

## 2018-02-15 IMAGING — DX DG ELBOW COMPLETE 3+V*L*
4 series · 4 of 4 positions shown · non-contrast
Comparison: None.

CLINICAL DATA: Fell off 2 story roof today with pain and swelling

EXAM:
LEFT ELBOW - COMPLETE 3+ VIEW

[elbow ap]
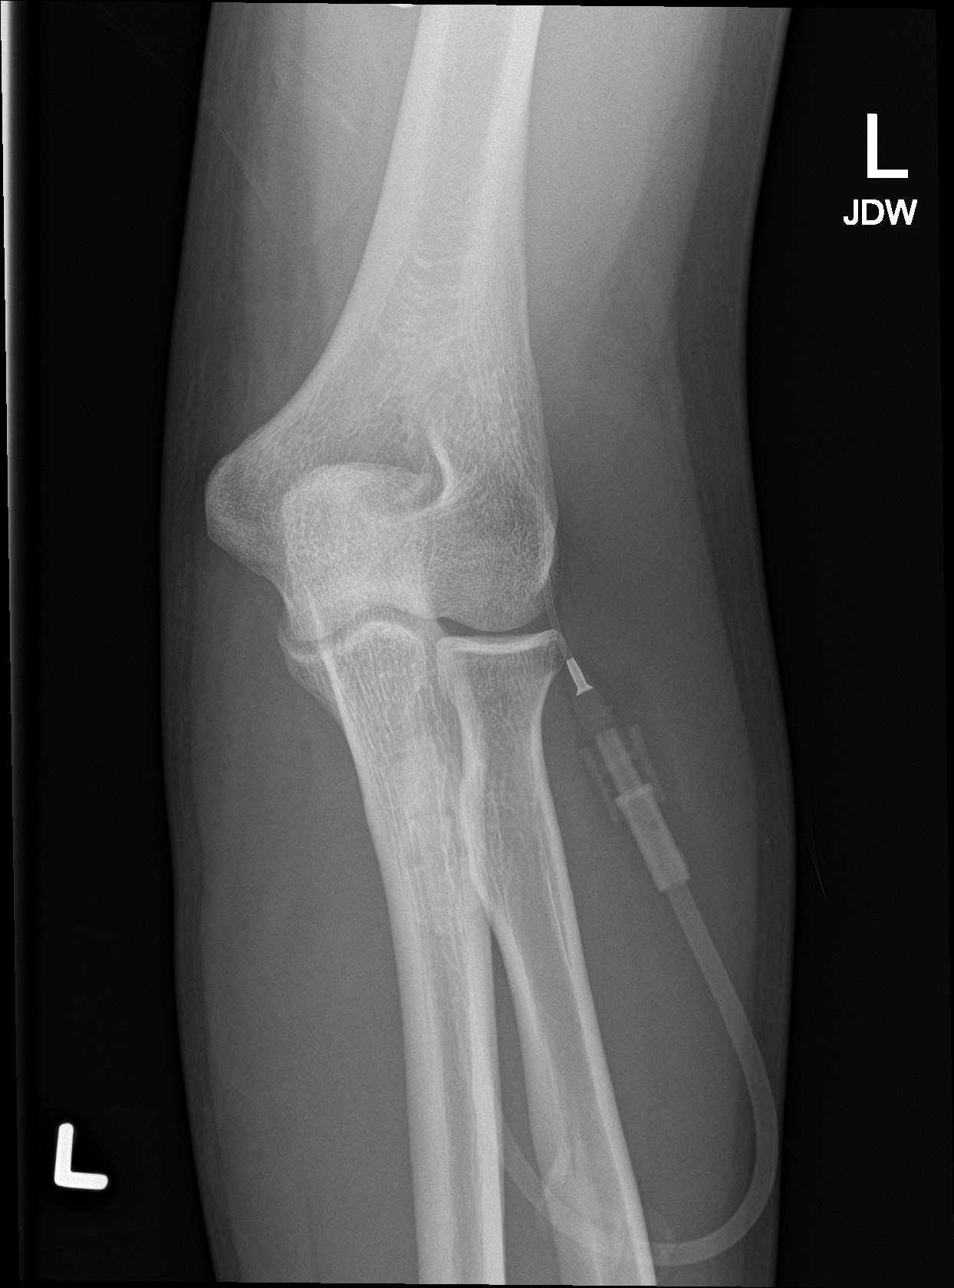

[elbow lat]
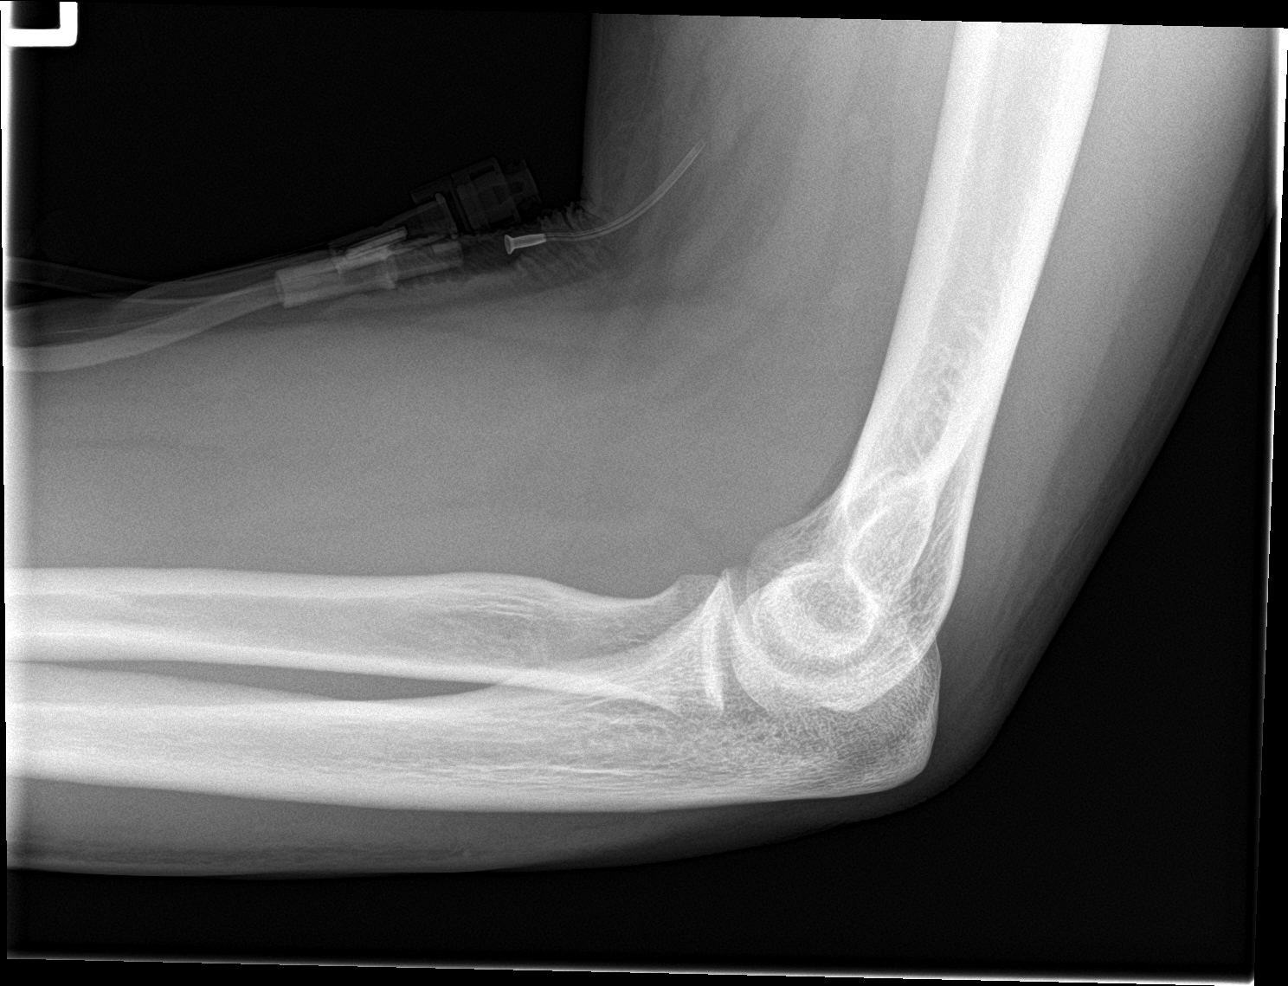

[elbow obl (1 of 2)]
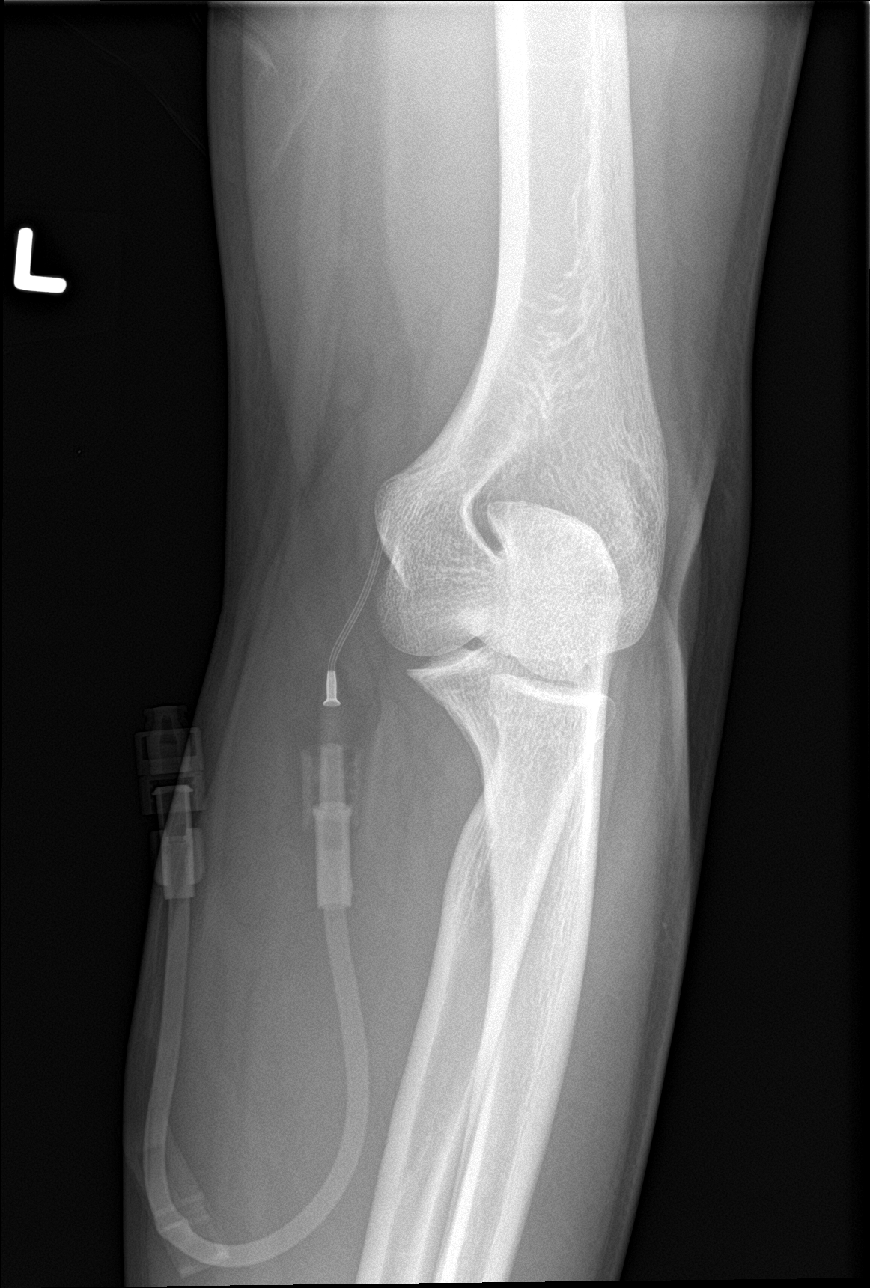

[elbow obl (2 of 2)]
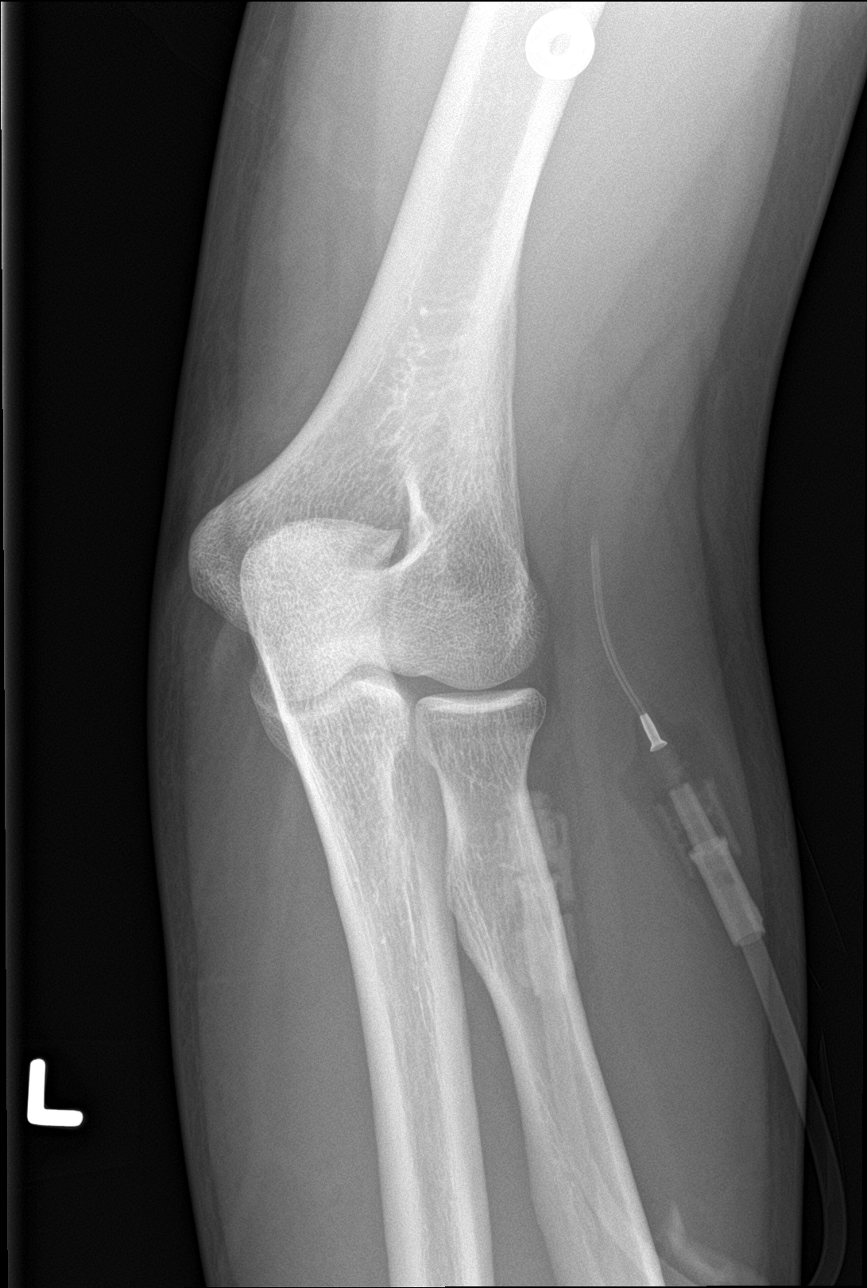

[4 of 4 positions shown; findings below may reference images not displayed]

FINDINGS: There is no evidence of fracture, dislocation, or joint effusion.
There is no evidence of arthropathy or other focal bone abnormality.
Soft tissues are unremarkable.
IMPRESSION: Negative.

## 2018-02-15 IMAGING — CT CT CHEST W/ CM
2 of 5 series · 12 of 36 positions shown, 15 images · IV contrast (Omni 300)
Comparison: None.

CLINICAL DATA: Pain following trauma/fall

EXAM:
CT CHEST, ABDOMEN, AND PELVIS WITH CONTRAST
TECHNIQUE: Multidetector CT imaging of the chest, abdomen and pelvis was
performed following the standard protocol during bolus
administration of intravenous contrast.
CONTRAST:  100mL 5EQ0MI-500 IOPAMIDOL (5EQ0MI-500) INJECTION 61%

[Series 4: cap with 5mm st · axial · 0.82mm/px · z∈[-889,-344]mm · 9 of 135 slices shown, 12 images]
[im 13/135  mediastinal]
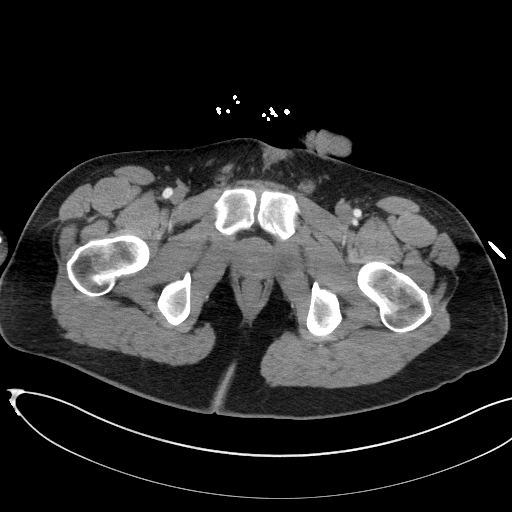
[im 13/135  lung]
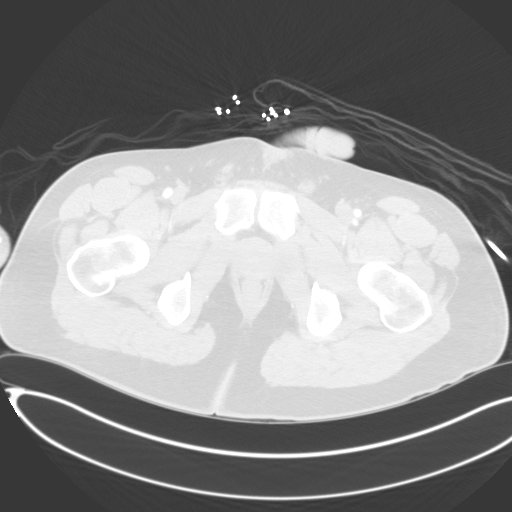
[im 25/135  lung]
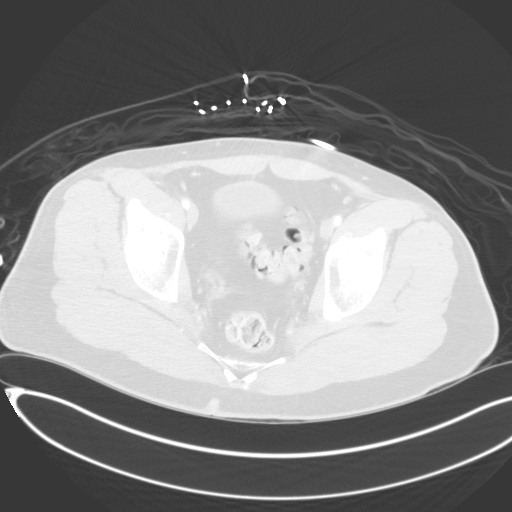
[im 37/135  lung]
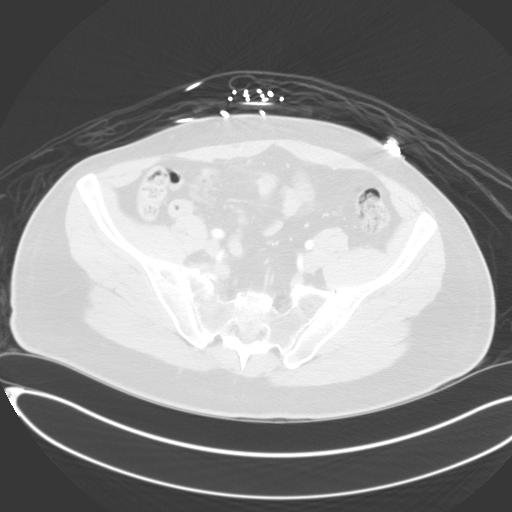
[im 49/135  lung]
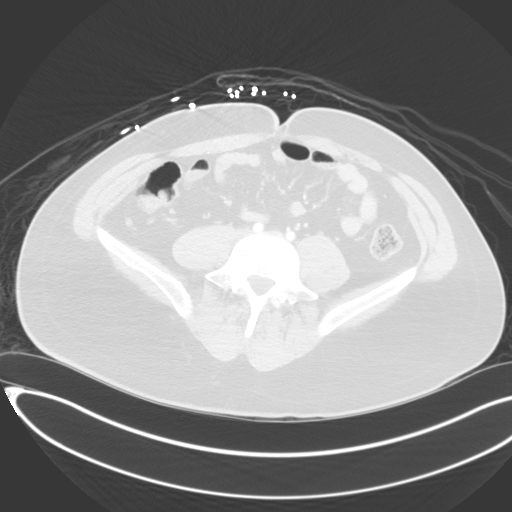
[im 74/135  mediastinal]
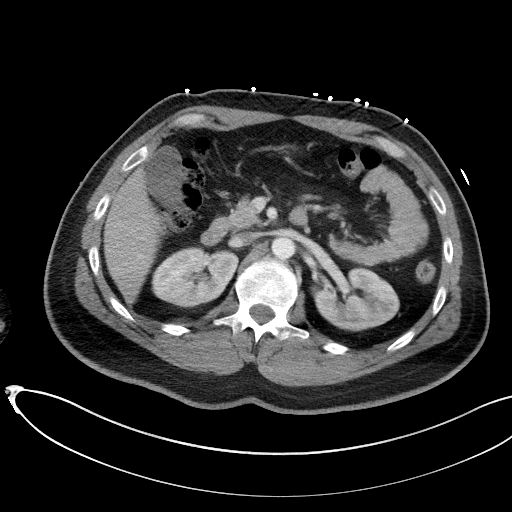
[im 74/135  lung]
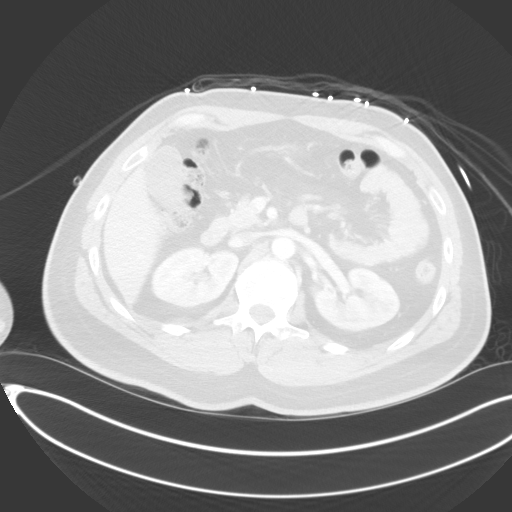
[im 86/135  lung]
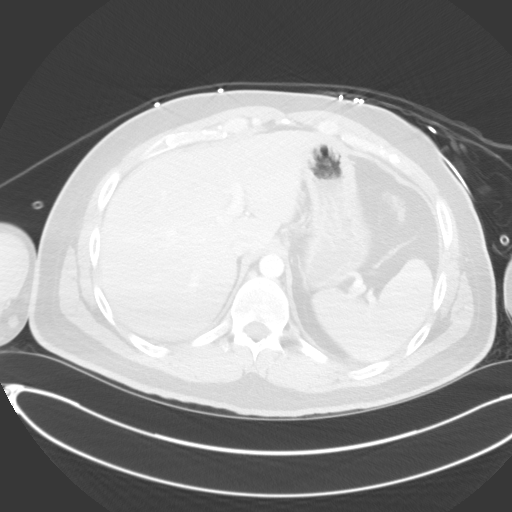
[im 98/135  lung]
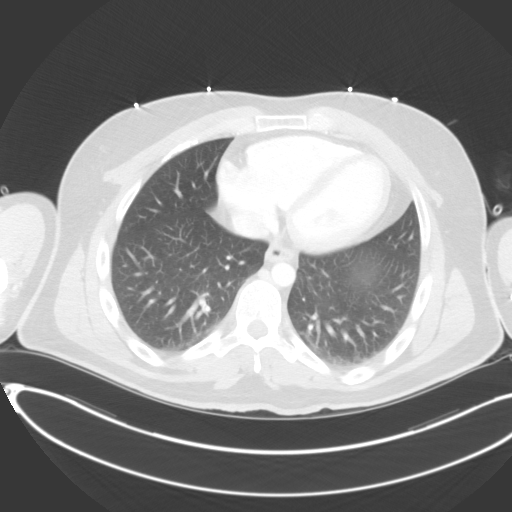
[im 110/135  lung]
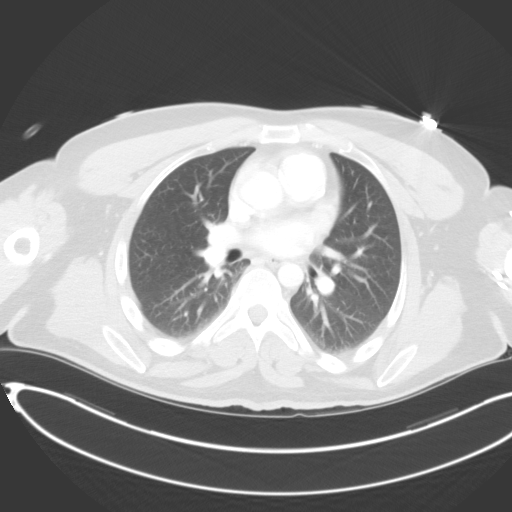
[im 122/135  mediastinal]
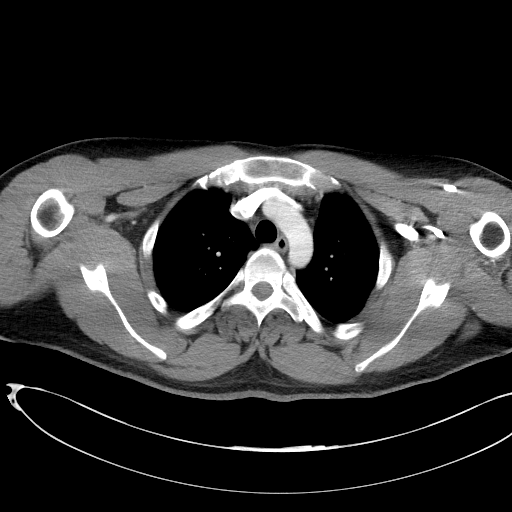
[im 122/135  lung]
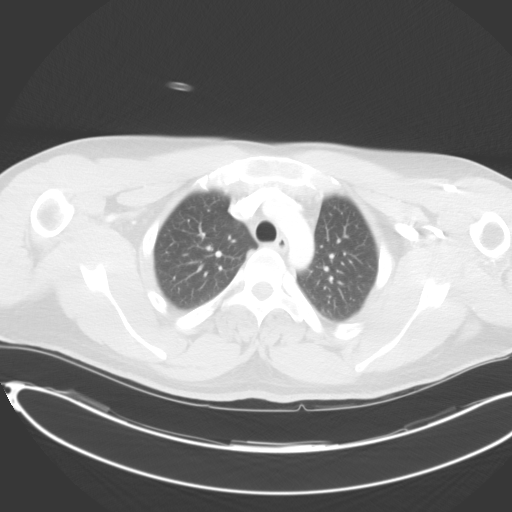

[Series 5: cap with 3mm st cor · coronal · 0.81mm/px · 3 of 118 slices shown]
[im 24/118  lung]
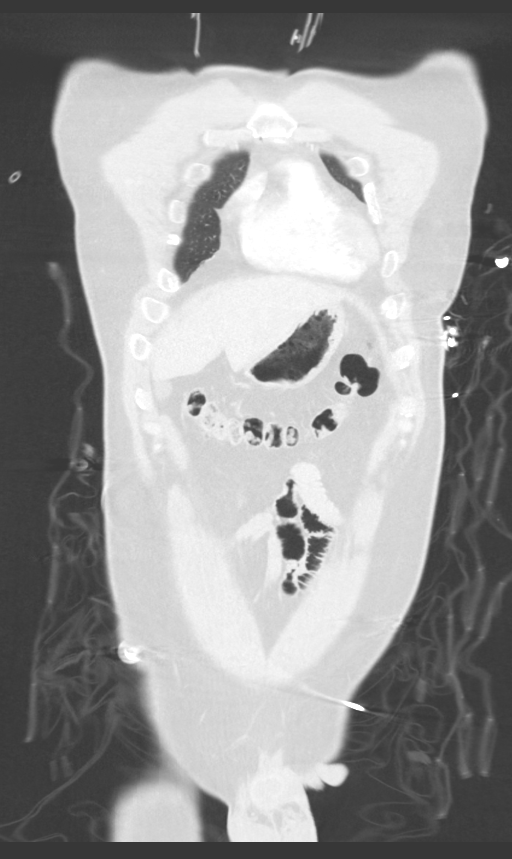
[im 47/118  lung]
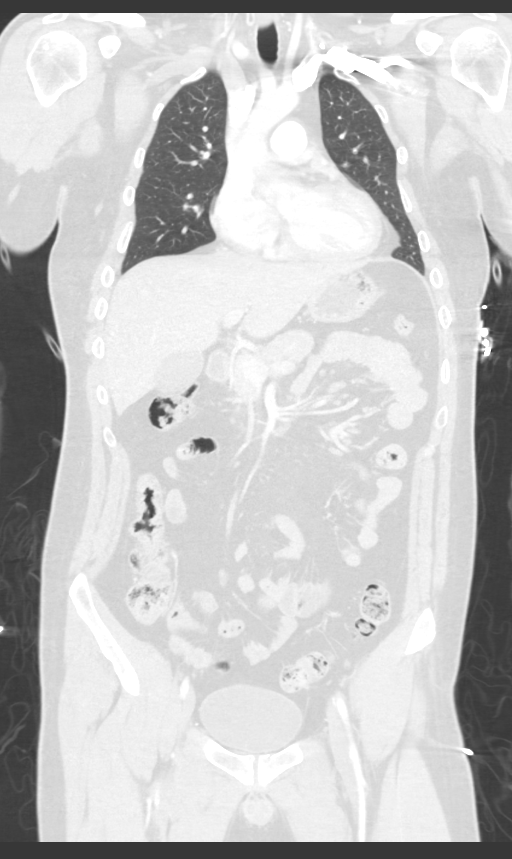
[im 71/118  lung]
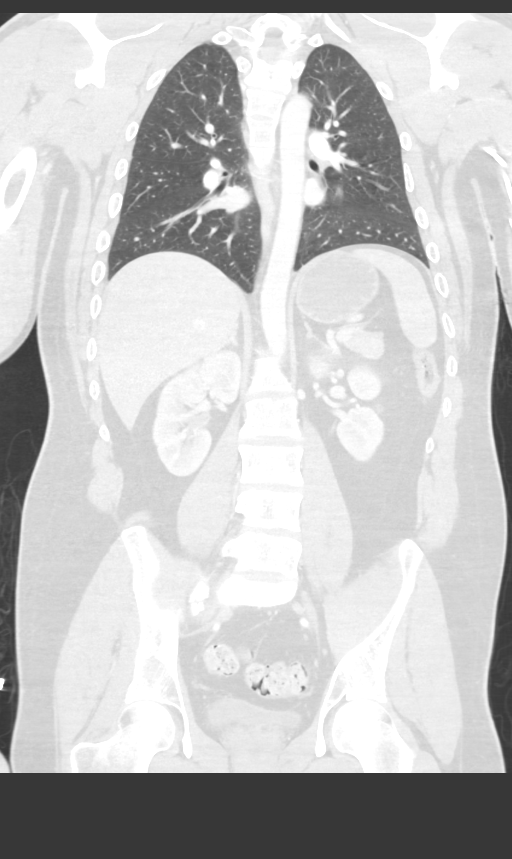

[12 of 36 positions shown; findings below may reference images not displayed]

FINDINGS: CT CHEST FINDINGS

Cardiovascular: There is no demonstrable mediastinal hematoma. There
is no thoracic aortic aneurysm or dissection. No aortic mucosal
lesion or periaortic fluid evident. No evident contrast
extravasation. The visualized great vessels appear unremarkable. The
pericardium is not appreciably thickened.

Mediastinum/Nodes: Visualized thyroid appears unremarkable. There is
no appreciable thoracic adenopathy.

Lungs/Pleura: There is no lung edema or consolidation. No
pneumothorax or contusion. There is slight bibasilar atelectasis.
There is no pleural effusion or pleural thickening evident.

Musculoskeletal: There is no fracture or dislocation evident in
thoracic region. No blastic or lytic bone lesions. No chest wall
lesions evident.

CT ABDOMEN PELVIS FINDINGS

Hepatobiliary: Liver appears intact without laceration or rupture.
No perihepatic fluid. No focal liver lesions are evident.
Gallbladder wall is not appreciably thickened. There is no biliary
duct dilatation.

Pancreas: Pancreas appears intact. No peripancreatic fluid. No
pancreatic mass or inflammatory focus.

Spleen: Spleen appears intact without laceration or rupture. No
perisplenic fluid. No focal splenic lesions.

Adrenals/Urinary Tract: Adrenals appear normal bilaterally. Kidneys
bilaterally show no evidence of mass or hydronephrosis. There is no
perinephric fluid or stranding. No contrast extravasation. No renal
laceration or rupture. No renal or ureteral calculus is evident on
either side. Urinary bladder is midline with wall thickness within
normal limits.

Stomach/Bowel: There is no appreciable bowel wall or mesenteric
thickening. There is no bowel obstruction. No free air or portal
venous air.

Vascular/Lymphatic: Aorta appears intact. There is no periaortic
fluid. No abdominal aortic aneurysm. Major mesenteric vessels appear
patent. No vascular lesions are evident. There is no adenopathy in
the abdomen or pelvis.

Reproductive: Prostate and seminal vesicles appear normal in size
and contour. There are a few tiny prostatic calculi. There is no
pelvic mass or pelvic fluid collection.

Other: The appendix appears normal. There is no ascites or abscess
in the abdomen or pelvis. There are no intraperitoneal or
retroperitoneal fluid collections in the abdomen or pelvis.

Musculoskeletal: There is a comminuted fracture of the right sacral
ala. Mild anterior displacement of a fracture fragment along the
anterior sacral ala on the right is noted. There is a fracture of
the superior left pubic symphysis in near anatomic alignment. There
is a fracture of the each anterior acetabular region without
fracture fragments extending into the respective hip joints. No
other fractures are evident. No dislocations. There are several
Schmorl's nodes in the upper lumbar region, an anatomic variant.
There are no blastic or lytic bone lesions. There is no
intramuscular or abdominal wall lesion.
IMPRESSION: CT chest: No vascular lesion evident. No fracture or dislocation.
Slight bibasilar atelectasis without parenchymal lung contusion or
pneumothorax. No edema or consolidation. No adenopathy.

CT abdomen and pelvis: Comminuted fracture right sacral ala.
Fracture along the superior left pubic symphysis in near anatomic
alignment. Fractures of each anterior acetabular region without
fracture fragments extending into the respective hip joints.

Visceral structures appear intact without laceration or rupture. No
bowel wall thickening or bowel obstruction. No abscess. No abnormal
fluid collections. Appendix appears normal. No renal or ureteral
calculi. No hydronephrosis. There are a few tiny prostatic calculi.
No vascular lesion evident.

## 2018-05-06 MED FILL — SERTRALINE HCL 50 MG TABLET: 50 | 90 days supply | Qty: 90 | Fill #1

## 2018-07-17 DIAGNOSIS — Z0000 Encounter for general adult medical examination without abnormal findings: Secondary | ICD-10-CM | POA: Diagnosis not present

## 2018-07-24 DIAGNOSIS — E7841 Elevated Lipoprotein(a): Secondary | ICD-10-CM | POA: Diagnosis not present

## 2018-07-24 DIAGNOSIS — Z0000 Encounter for general adult medical examination without abnormal findings: Secondary | ICD-10-CM | POA: Diagnosis not present

## 2018-07-24 DIAGNOSIS — Z1331 Encounter for screening for depression: Secondary | ICD-10-CM | POA: Diagnosis not present

## 2018-07-24 DIAGNOSIS — K589 Irritable bowel syndrome without diarrhea: Secondary | ICD-10-CM | POA: Diagnosis not present

## 2018-07-24 DIAGNOSIS — R05 Cough: Secondary | ICD-10-CM | POA: Diagnosis not present

## 2018-07-24 DIAGNOSIS — F411 Generalized anxiety disorder: Secondary | ICD-10-CM | POA: Diagnosis not present

## 2018-07-24 DIAGNOSIS — Z1339 Encounter for screening examination for other mental health and behavioral disorders: Secondary | ICD-10-CM | POA: Diagnosis not present

## 2018-07-24 MED FILL — ALBUTEROL SULFATE HFA 108 (: 108 (90 BAS | 25 days supply | Qty: 18 | Fill #0

## 2018-07-24 MED FILL — SERTRALINE HCL 50 MG TABLET: 50 | 90 days supply | Qty: 90 | Fill #0

## 2018-10-28 ENCOUNTER — Ambulatory Visit (INDEPENDENT_AMBULATORY_CARE_PROVIDER_SITE_OTHER): Payer: 59 | Admitting: Sports Medicine

## 2018-10-28 ENCOUNTER — Other Ambulatory Visit: Payer: Self-pay

## 2018-10-28 ENCOUNTER — Encounter: Payer: Self-pay | Admitting: Sports Medicine

## 2018-10-28 ENCOUNTER — Ambulatory Visit (INDEPENDENT_AMBULATORY_CARE_PROVIDER_SITE_OTHER): Payer: 59

## 2018-10-28 DIAGNOSIS — M5412 Radiculopathy, cervical region: Secondary | ICD-10-CM

## 2018-10-28 DIAGNOSIS — M542 Cervicalgia: Secondary | ICD-10-CM | POA: Diagnosis not present

## 2018-10-28 DIAGNOSIS — M25521 Pain in right elbow: Secondary | ICD-10-CM | POA: Diagnosis not present

## 2018-10-28 DIAGNOSIS — S52121D Displaced fracture of head of right radius, subsequent encounter for closed fracture with routine healing: Secondary | ICD-10-CM | POA: Diagnosis not present

## 2018-10-28 MED ORDER — CYCLOBENZAPRINE HCL 10 MG PO TABS: ORAL_TABLET | ORAL | 0 refills | Status: DC

## 2018-10-28 MED ORDER — PREDNISONE 50 MG PO TABS: ORAL_TABLET | ORAL | 0 refills | Status: DC

## 2018-10-28 NOTE — Progress Notes (Signed)
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  Unfortunately over the past several weeks to months he is noted pain in his right elbow, radiating from his neck down to the dorsum of his hand, sometimes to the second and third fingers.  Worse with heavy lifting.  He does have a history of a fall with polytrauma involving a sacral fracture, pelvic fracture, T1 fracture as well as a radial head fracture.  His right radial head fracture pain resolved completely after his injury.  I reviewed the past medical history, family history, social history, surgical history, and allergies today and no changes were needed.  Please see the problem list section below in epic for further details.  Past Medical History: Past Medical History:  Diagnosis Date  . Dyspepsia   . GERD (gastroesophageal reflux disease)   . IBS (irritable bowel syndrome)    Past Surgical History: Past Surgical History:  Procedure Laterality Date  . 2 other hand surgeries,2006, 2002    . ARTERY REPAIR Right 01/26/2014   Procedure: right thumb exploratio and repair artery;  Surgeon: Daryll Brod, MD;  Location: [facility];  Service: Orthopedics;  Laterality: Right;  . HAND SURGERY     2002, 2006   Social History: Social History   Socioeconomic History  . Marital status: Married    Spouse name: Not on file  . Number of children: Not on file  . Years of education: Not on file  . Highest education level: Not on file  Occupational History  . Not on file  Social Needs  . Financial resource strain: Not on file  . Food insecurity    Worry: Not on file    Inability: Not on file  . Transportation needs    Medical: Not on file    Non-medical: Not on file  Tobacco Use  . Smoking status: Never Smoker  . Smokeless tobacco: Never Used  Substance and Sexual Activity  . Alcohol use: No   Comment: social  . Drug use: No  . Sexual activity: Not on file  Lifestyle  . Physical activity    Days per week: Not on file    Minutes per session: Not on file  . Stress: Not on file  Relationships  . Social Herbalist on phone: Not on file    Gets together: Not on file    Attends religious service: Not on file    Active member of club or organization: Not on file    Attends meetings of clubs or organizations: Not on file    Relationship status: Not on file  Other Topics Concern  . Not on file  Social History Narrative   ** Merged History Encounter **       Family History: Family History  Problem Relation Age of Onset  . Deep vein thrombosis Mother   . Prostate cancer Father   . Deep vein thrombosis Sister    Allergies: No Known Allergies Medications: See med rec.  Review of Systems: No headache, visual changes, nausea, vomiting, diarrhea, constipation, dizziness, abdominal pain, skin rash, fevers, chills, night sweats, swollen lymph nodes, weight loss, chest pain, body aches, joint swelling, muscle aches, shortness of breath, mood changes, visual or auditory hallucinations.  Objective:    General: Well Developed, well nourished, and in no acute distress.  Neuro:  Alert and oriented x3, extra-ocular muscles intact, sensation grossly intact.  HEENT: Normocephalic, atraumatic, pupils equal round reactive to light, neck supple, no masses, no lymphadenopathy, thyroid nonpalpable.  Skin: Warm and dry, no rashes noted.  Cardiac: Regular rate and rhythm, no murmurs rubs or gallops.  Respiratory: Clear to auscultation bilaterally. Not using accessory muscles, speaking in full sentences.  Abdominal: Soft, nontender, nondistended, positive bowel sounds, no masses, no organomegaly.  Right elbow: Unremarkable to inspection. Range of motion full pronation, supination, flexion, extension. Strength is full to all of the above directions Stable to varus, valgus stress.  Negative moving valgus stress test. No discrete areas of tenderness to palpation. Ulnar nerve does not sublux. Negative cubital tunnel Tinel's. Neck: Positive Spurling's on the right Full neck range of motion Grip strength and sensation normal in bilateral hands Strength good C4 to T1 distribution No sensory change to C4 to T1 Reflexes normal  Impression and Recommendations:    The patient was counselled, risk factors were discussed, anticipatory guidance given.  Radiculitis of right cervical region Right C7 radicular symptoms, x-rays, prednisone, Flexeril. Formal PT, he will avoid heavy lifting greater than 20 pounds over the next 3 to 4 weeks. Return to see me in 6 weeks.  Closed displaced fracture of head of right radius Question posttraumatic osteoarthritis, he did have a minimally displaced radial head fracture, repeating x-rays today. If significant intra-articular osteoarthritis we will consider an elbow joint injection for diagnostic and therapeutic purposes at the follow-up visit.   ___________________________________________ Gwen Her. Dianah Field, M.D., ABFM., CAQSM. Primary Care and Sports Medicine [facility]  Adjunct Professor of [employer]

## 2018-10-28 NOTE — Assessment & Plan Note (Signed)
Right C7 radicular symptoms, x-rays, prednisone, Flexeril. Formal PT, he will avoid heavy lifting greater than 20 pounds over the next 3 to 4 weeks. Return to see me in 6 weeks.

## 2018-10-28 NOTE — Assessment & Plan Note (Signed)
Question posttraumatic osteoarthritis, he did have a minimally displaced radial head fracture, repeating x-rays today. If significant intra-articular osteoarthritis we will consider an elbow joint injection for diagnostic and therapeutic purposes at the follow-up visit.

## 2018-10-29 MED FILL — predniSONE 50 MG TABS: 50 | 5 days supply | Qty: 5 | Fill #0

## 2018-10-29 MED FILL — CYCLOBENZAPRINE 10 MG TAB: 10 | 10 days supply | Qty: 30 | Fill #0

## 2018-11-03 ENCOUNTER — Ambulatory Visit: Payer: 59 | Admitting: Family Medicine

## 2018-11-10 DIAGNOSIS — M25511 Pain in right shoulder: Secondary | ICD-10-CM | POA: Diagnosis not present

## 2018-11-20 MED FILL — SERTRALINE HCL 50 MG TABLET: 50 | 90 days supply | Qty: 90 | Fill #0

## 2018-12-09 ENCOUNTER — Ambulatory Visit: Payer: 59 | Admitting: Sports Medicine

## 2019-01-18 DIAGNOSIS — Z20828 Contact with and (suspected) exposure to other viral communicable diseases: Secondary | ICD-10-CM | POA: Diagnosis not present

## 2019-06-02 MED FILL — SERTRALINE HCL 50 MG TABLET: 50 | 90 days supply | Qty: 90 | Fill #1

## 2019-06-09 DIAGNOSIS — Z23 Encounter for immunization: Secondary | ICD-10-CM | POA: Diagnosis not present

## 2019-07-29 ENCOUNTER — Other Ambulatory Visit (HOSPITAL_COMMUNITY): Payer: Self-pay | Admitting: Internal Medicine

## 2019-07-29 DIAGNOSIS — E7841 Elevated Lipoprotein(a): Secondary | ICD-10-CM | POA: Diagnosis not present

## 2019-07-29 DIAGNOSIS — Z1331 Encounter for screening for depression: Secondary | ICD-10-CM | POA: Diagnosis not present

## 2019-07-29 DIAGNOSIS — R82998 Other abnormal findings in urine: Secondary | ICD-10-CM | POA: Diagnosis not present

## 2019-07-29 DIAGNOSIS — K589 Irritable bowel syndrome without diarrhea: Secondary | ICD-10-CM | POA: Diagnosis not present

## 2019-07-29 DIAGNOSIS — F411 Generalized anxiety disorder: Secondary | ICD-10-CM | POA: Diagnosis not present

## 2019-07-29 DIAGNOSIS — Z0000 Encounter for general adult medical examination without abnormal findings: Secondary | ICD-10-CM | POA: Diagnosis not present

## 2019-07-29 DIAGNOSIS — E663 Overweight: Secondary | ICD-10-CM | POA: Diagnosis not present

## 2019-07-29 DIAGNOSIS — Z1339 Encounter for screening examination for other mental health and behavioral disorders: Secondary | ICD-10-CM | POA: Diagnosis not present

## 2019-10-02 MED FILL — CEPHALEXIN 500 MG CAPSULE: 500 | 7 days supply | Qty: 21 | Fill #0

## 2020-01-05 DIAGNOSIS — Z20822 Contact with and (suspected) exposure to covid-19: Secondary | ICD-10-CM | POA: Diagnosis not present

## 2020-02-05 ENCOUNTER — Other Ambulatory Visit (HOSPITAL_COMMUNITY): Payer: Self-pay | Admitting: Endodontics

## 2020-02-05 MED FILL — IBUPROFEN 800 MG TABS: 800 | 4 days supply | Qty: 16 | Fill #0

## 2020-02-05 MED FILL — traMADol HCL 50 MG TABS: 50 | 3 days supply | Qty: 10 | Fill #0

## 2020-02-05 MED FILL — AMOXICILLIN 500 MG CAPSULE: 500 | 6 days supply | Qty: 25 | Fill #0

## 2020-06-14 ENCOUNTER — Other Ambulatory Visit (HOSPITAL_COMMUNITY): Payer: Self-pay

## 2020-06-14 MED FILL — Sertraline HCl Tab 50 MG: ORAL | 90 days supply | Qty: 90 | Fill #0 | Status: AC

## 2020-09-20 DIAGNOSIS — M79641 Pain in right hand: Secondary | ICD-10-CM | POA: Diagnosis not present

## 2020-09-20 DIAGNOSIS — S60011A Contusion of right thumb without damage to nail, initial encounter: Secondary | ICD-10-CM | POA: Diagnosis not present

## 2020-09-20 DIAGNOSIS — M79642 Pain in left hand: Secondary | ICD-10-CM | POA: Diagnosis not present

## 2020-09-27 ENCOUNTER — Other Ambulatory Visit: Payer: Self-pay

## 2020-09-27 ENCOUNTER — Encounter: Payer: Self-pay | Admitting: Neurology

## 2020-09-27 DIAGNOSIS — R202 Paresthesia of skin: Secondary | ICD-10-CM

## 2020-09-27 NOTE — Progress Notes (Signed)
emg 

## 2020-09-28 ENCOUNTER — Other Ambulatory Visit: Payer: Self-pay

## 2020-09-28 DIAGNOSIS — R202 Paresthesia of skin: Secondary | ICD-10-CM

## 2020-11-01 ENCOUNTER — Ambulatory Visit (INDEPENDENT_AMBULATORY_CARE_PROVIDER_SITE_OTHER): Payer: 59 | Admitting: Neurology

## 2020-11-01 ENCOUNTER — Other Ambulatory Visit: Payer: Self-pay

## 2020-11-01 DIAGNOSIS — R202 Paresthesia of skin: Secondary | ICD-10-CM

## 2020-11-01 DIAGNOSIS — G5603 Carpal tunnel syndrome, bilateral upper limbs: Secondary | ICD-10-CM

## 2020-11-01 NOTE — Procedures (Signed)
[facility]  [address], [address]  [place], [place] [address] Tel: [phone] Fax:  [phone] Test Date:  11/01/2020  Patient: William Holt DOB: [birth year] Physician: Narda Amber, DO  Sex: Male Height: 6' " Ref Phys: Roseanne Kaufman  ID#: [id number]   Technician:    Patient Complaints: This is a 35 year old man referred for evaluation of left hand paresthesias.  NCV & EMG Findings: Extensive electrodiagnostic testing of the left upper extremity and additional studies of the right shows:  Left median sensory response shows prolonged latency (4.4 ms) and reduced amplitude (14.0 V).  Right median sensory response shows prolonged latency (3.7 ms) and normal amplitude.  Bilateral ulnar sensory responses are within normal limits. Left median motor response shows prolonged latency (4.5 ms).  Right median and bilateral ulnar motor responses are within normal limits.   There is no evidence of active or chronic motor axonal loss changes affecting any of the tested muscles.  Motor unit configuration and recruitment pattern is within normal limits.    Impression: Left median neuropathy at or distal to the wrist (moderate), consistent with a clinical diagnosis of carpal tunnel syndrome.   Right median neuropathy at or distal to the wrist (mild), consistent with a clinical diagnosis of carpal tunnel syndrome.     ___________________________ Narda Amber, DO    Nerve Conduction Studies Anti Sensory Summary Table   Stim Site NR Peak (ms) Norm Peak (ms) P-T Amp (V) Norm P-T Amp  Left Median Anti Sensory (2nd Digit)  33C  Wrist    4.4 <3.4 14.0 >20  Right Median Anti Sensory (2nd Digit)  33C  Wrist    3.7 <3.4 25.9 >20  Left Ulnar Anti Sensory (5th Digit)  33C  Wrist    2.7 <3.1 22.1 >12  Right Ulnar Anti Sensory (5th Digit)  33C  Wrist    3.0 <3.1 20.5 >12   Motor Summary Table   Stim Site NR Onset (ms) Norm Onset (ms) O-P Amp (mV) Norm O-P Amp Site1 Site2  Delta-0 (ms) Dist (cm) Vel (m/s) Norm Vel (m/s)  Left Median Motor (Abd Poll Brev)  33C  Wrist    4.5 <3.9 12.9 >6 Elbow Wrist 5.3 30.0 57 >50  Elbow    9.8  12.9         Right Median Motor (Abd Poll Brev)  33C  Wrist    3.4 <3.9 11.8 >6 Elbow Wrist 4.9 30.0 61 >50  Elbow    8.3  11.5         Left Ulnar Motor (Abd Dig Minimi)  33C  Wrist    2.6 <3.1 12.8 >7 B Elbow Wrist 3.7 23.0 62 >50  B Elbow    6.3  12.1  A Elbow B Elbow 1.7 10.0 59 >50  A Elbow    8.0  11.3         Right Ulnar Motor (Abd Dig Minimi)  33C  Wrist    2.2 <3.1 13.5 >7 B Elbow Wrist 4.2 27.0 64 >50  B Elbow    6.4  12.5  A Elbow B Elbow 1.7 10.0 59 >50  A Elbow    8.1  12.0          EMG   Side Muscle Ins Act Fibs Psw Fasc Number Recrt Dur Dur. Amp Amp. Poly Poly. Comment  Right 1stDorInt Nml Nml Nml Nml Nml Nml Nml Nml Nml Nml Nml Nml N/A  Right Abd Poll Brev Nml Nml Nml  Nml Nml Nml Nml Nml Nml Nml Nml Nml N/A  Right PronatorTeres Nml Nml Nml Nml Nml Nml Nml Nml Nml Nml Nml Nml N/A  Right Biceps Nml Nml Nml Nml Nml Nml Nml Nml Nml Nml Nml Nml N/A  Right Triceps Nml Nml Nml Nml Nml Nml Nml Nml Nml Nml Nml Nml N/A  Right Deltoid Nml Nml Nml Nml Nml Nml Nml Nml Nml Nml Nml Nml N/A  Left 1stDorInt Nml Nml Nml Nml Nml Nml Nml Nml Nml Nml Nml Nml N/A  Left Abd Poll Brev Nml Nml Nml Nml Nml Nml Nml Nml Nml Nml Nml Nml N/A  Left PronatorTeres Nml Nml Nml Nml Nml Nml Nml Nml Nml Nml Nml Nml N/A  Left Biceps Nml Nml Nml Nml Nml Nml Nml Nml Nml Nml Nml Nml N/A  Left Triceps Nml Nml Nml Nml Nml Nml Nml Nml Nml Nml Nml Nml N/A  Left Deltoid Nml Nml Nml Nml Nml Nml Nml Nml Nml Nml Nml Nml N/A      Waveforms:

## 2020-11-08 DIAGNOSIS — G5602 Carpal tunnel syndrome, left upper limb: Secondary | ICD-10-CM | POA: Diagnosis not present

## 2020-11-24 DIAGNOSIS — Z23 Encounter for immunization: Secondary | ICD-10-CM | POA: Diagnosis not present

## 2020-12-19 ENCOUNTER — Other Ambulatory Visit: Payer: Self-pay

## 2020-12-19 ENCOUNTER — Other Ambulatory Visit (HOSPITAL_COMMUNITY): Payer: Self-pay

## 2020-12-20 ENCOUNTER — Other Ambulatory Visit (HOSPITAL_COMMUNITY): Payer: Self-pay

## 2020-12-21 ENCOUNTER — Other Ambulatory Visit (HOSPITAL_COMMUNITY): Payer: Self-pay

## 2020-12-21 MED ORDER — SERTRALINE HCL 50 MG PO TABS
50.0000 mg | ORAL_TABLET | Freq: Every day | ORAL | 0 refills | Status: DC
  Filled 2020-12-21: qty 90, 90d supply, fill #0

## 2020-12-28 ENCOUNTER — Other Ambulatory Visit (HOSPITAL_COMMUNITY): Payer: Self-pay

## 2020-12-28 DIAGNOSIS — G5602 Carpal tunnel syndrome, left upper limb: Secondary | ICD-10-CM | POA: Diagnosis not present

## 2020-12-28 MED ORDER — HYDROCODONE-ACETAMINOPHEN 5-325 MG PO TABS
1.0000 | ORAL_TABLET | Freq: Four times a day (QID) | ORAL | 0 refills | Status: DC | PRN
  Filled 2020-12-28: qty 20, 5d supply, fill #0

## 2021-02-16 DIAGNOSIS — E7800 Pure hypercholesterolemia, unspecified: Secondary | ICD-10-CM | POA: Diagnosis not present

## 2021-03-11 ENCOUNTER — Emergency Department (HOSPITAL_BASED_OUTPATIENT_CLINIC_OR_DEPARTMENT_OTHER)
Admission: EM | Admit: 2021-03-11 | Discharge: 2021-03-11 | Discharge status: Against Medical Advice | Payer: No Typology Code available for payment source

## 2021-03-11 ENCOUNTER — Other Ambulatory Visit: Payer: Self-pay

## 2021-03-11 NOTE — ED Notes (Signed)
Called twice for triage and no response.

## 2021-03-11 NOTE — ED Notes (Signed)
Patient called once again for Triage with No Response. Patient not visualized in Department or Waiting Area. Patient to be discharged accordingly.

## 2021-08-04 ENCOUNTER — Other Ambulatory Visit: Payer: Self-pay | Admitting: Family Medicine

## 2021-08-04 ENCOUNTER — Other Ambulatory Visit (HOSPITAL_COMMUNITY): Payer: Self-pay | Admitting: Family Medicine

## 2021-08-04 DIAGNOSIS — R42 Dizziness and giddiness: Secondary | ICD-10-CM

## 2021-08-14 ENCOUNTER — Ambulatory Visit (HOSPITAL_COMMUNITY)
Admission: RE | Admit: 2021-08-14 | Discharge: 2021-08-14 | Disposition: A | Discharge status: Home / Self Care | Payer: No Typology Code available for payment source | Source: Ambulatory Visit | Attending: Family Medicine | Admitting: Family Medicine

## 2021-08-14 DIAGNOSIS — R42 Dizziness and giddiness: Secondary | ICD-10-CM | POA: Insufficient documentation

## 2021-11-07 ENCOUNTER — Other Ambulatory Visit (HOSPITAL_COMMUNITY): Payer: Self-pay

## 2021-11-07 MED ORDER — SERTRALINE HCL 50 MG PO TABS
50.0000 mg | ORAL_TABLET | Freq: Every day | ORAL | 3 refills | Status: DC
  Filled 2021-11-07: qty 90, 90d supply, fill #0

## 2021-11-07 MED ORDER — AMPHETAMINE-DEXTROAMPHET ER 30 MG PO CP24
30.0000 mg | ORAL_CAPSULE | Freq: Every morning | ORAL | 0 refills | Status: DC
  Filled 2021-11-07: qty 30, 30d supply, fill #0

## 2021-11-10 ENCOUNTER — Other Ambulatory Visit (HOSPITAL_COMMUNITY): Payer: Self-pay

## 2021-11-10 MED ORDER — AMPHETAMINE-DEXTROAMPHETAMINE 20 MG PO TABS
20.0000 mg | ORAL_TABLET | Freq: Two times a day (BID) | ORAL | 0 refills | Status: DC
  Filled 2021-11-10: qty 60, 30d supply, fill #0

## 2021-11-21 ENCOUNTER — Other Ambulatory Visit (HOSPITAL_COMMUNITY): Payer: Self-pay

## 2022-01-25 ENCOUNTER — Other Ambulatory Visit: Payer: Self-pay

## 2022-01-25 ENCOUNTER — Ambulatory Visit (INDEPENDENT_AMBULATORY_CARE_PROVIDER_SITE_OTHER): Payer: No Typology Code available for payment source

## 2022-01-25 ENCOUNTER — Ambulatory Visit (INDEPENDENT_AMBULATORY_CARE_PROVIDER_SITE_OTHER): Payer: No Typology Code available for payment source | Admitting: Orthopaedic Surgery

## 2022-01-25 DIAGNOSIS — M25521 Pain in right elbow: Secondary | ICD-10-CM

## 2022-01-25 MED ORDER — PREDNISONE 10 MG (21) PO TBPK
ORAL_TABLET | ORAL | 3 refills | Status: DC
  Filled 2022-02-01: qty 21, 6d supply, fill #0

## 2022-01-25 MED ORDER — IBUPROFEN 800 MG PO TABS: 800.0000 mg | ORAL_TABLET | Freq: Two times a day (BID) | ORAL | 2 refills | Status: AC | PRN

## 2022-01-25 NOTE — Progress Notes (Signed)
Office Visit Note   Patient: William Holt           Date of Birth: [birth year]           MRN: [id number] Visit Date: 01/25/2022              Requested by: No referring provider defined for this encounter. PCP: Pcp, No   Assessment & Plan: Visit Diagnoses:  1. Pain in right elbow     Plan: Impression is right elbow medial epicondylitis.  Disease process explained and treatment options were reviewed.  We will start with a prednisone Dosepak and followed by 2 weeks of Motrin as needed.  Home exercises and counterforce brace provided.  Will hold off on injection for now.  Rest as well.  Follow-Up Instructions: No follow-ups on file.   Orders:  Orders Placed This Encounter  Procedures   XR Elbow Complete Right (3+View)   Meds ordered this encounter  Medications   predniSONE (STERAPRED UNI-PAK 21 TAB) 10 MG (21) TBPK tablet    Sig: Take as directed    Dispense:  21 tablet    Refill:  3   ibuprofen (ADVIL) 800 MG tablet    Sig: Take 1 tablet (800 mg total) by mouth 2 (two) times daily as needed.    Dispense:  30 tablet    Refill:  2      Procedures: No procedures performed   Clinical Data: No additional findings.   Subjective: Chief Complaint  Patient presents with   Right Elbow - Pain    HPI William Holt is a 36 year old gentleman that I saw few years ago for radial head fracture.  He comes in for evaluation of 2 weeks of right elbow pain on the medial side.  Recently played several rounds of golf within a short period of time and developed pain afterwards.  His pain is to the medial epicondyle with occasional numbness and tingling.  He feels weakness due to the pain. Review of Systems  Constitutional: Negative.   HENT: Negative.    Eyes: Negative.   Respiratory: Negative.    Cardiovascular: Negative.   Gastrointestinal: Negative.   Endocrine: Negative.   Genitourinary: Negative.   Skin: Negative.   Allergic/Immunologic: Negative.   Neurological: Negative.    Hematological: Negative.   Psychiatric/Behavioral: Negative.    All other systems reviewed and are negative.    Objective: Vital Signs: There were no vitals taken for this visit.  Physical Exam Vitals and nursing note reviewed.  Constitutional:      Appearance: He is well-developed.  HENT:     Head: Normocephalic and atraumatic.  Eyes:     Pupils: Pupils are equal, round, and reactive to light.  Pulmonary:     Effort: Pulmonary effort is normal.  Abdominal:     Palpations: Abdomen is soft.  Musculoskeletal:        General: Normal range of motion.     Cervical back: Neck supple.  Skin:    General: Skin is warm.  Neurological:     Mental Status: He is alert and oriented to person, place, and time.  Psychiatric:        Behavior: Behavior normal.        Thought Content: Thought content normal.        Judgment: Judgment normal.     Ortho Exam Examination of the right elbow shows baseline range of motion.  He has tenderness to the medial epicondyle and pain with resistance to wrist flexion  and forearm pronation. Specialty Comments:  No specialty comments available.  Imaging: XR Elbow Complete Right (3+View)  Result Date: 01/25/2022 Three-view x-rays of the right elbow shows healed radial head fracture.  There are cystic changes in the capitellum.  Joint spaces well-preserved.  Loose body in the anterior elbow.    PMFS History: Patient Active Problem List   Diagnosis Date Noted   Pain in right elbow 01/25/2022   Radiculitis of right cervical region 10/28/2018   Fall from roof 02/08/2016   Concussion 02/08/2016   Skull fracture ([place]) 02/08/2016   Right orbit fracture ([place]) 02/08/2016   Left wrist fracture 02/08/2016   Closed T1 fracture ([place]) 02/08/2016   Multiple pelvic fractures ([place]) 02/08/2016   Urinary retention 02/08/2016   Closed displaced fracture of head of right radius    Closed displaced fracture of pelvis ([place])    Closed fracture of left wrist     TBI (traumatic brain injury) ([place]) 02/07/2016   Nonallopathic lesion of thoracic region 08/01/2015   Nonallopathic lesion of cervical region 08/01/2015   Nonallopathic lesion of lumbosacral region 08/01/2015   Scapular dysfunction 08/01/2015   PILONIDAL CYST 10/18/2009   BACK PAIN, THORACIC REGION 10/18/2009   FOOT PAIN, RIGHT 10/18/2009   B12 DEFICIENCY 10/11/2009   ALLERGIC RHINITIS 10/10/2009   FATIGUE 10/10/2009   PENILE LESION 10/07/2008   SEBACEOUS CYST, INFECTED 10/07/2008   Past Medical History:  Diagnosis Date   Dyspepsia    GERD (gastroesophageal reflux disease)    IBS (irritable bowel syndrome)     Family History  Problem Relation Age of Onset   Deep vein thrombosis Mother    Prostate cancer Father    Deep vein thrombosis Sister     Past Surgical History:  Procedure Laterality Date   2 other hand surgeries,2006, 2002     ARTERY REPAIR Right 01/26/2014   Procedure: right thumb exploratio and repair artery;  Surgeon: Daryll Brod, MD;  Location: [place] [facility];  Service: Orthopedics;  Laterality: Right;   HAND SURGERY     2002, 2006   Social History   Occupational History   Not on file  Tobacco Use   Smoking status: Never   Smokeless tobacco: Never  Substance and Sexual Activity   Alcohol use: No    Comment: social   Drug use: No   Sexual activity: Not on file

## 2022-02-01 ENCOUNTER — Other Ambulatory Visit: Payer: Self-pay

## 2022-02-20 ENCOUNTER — Other Ambulatory Visit (HOSPITAL_COMMUNITY): Payer: Self-pay

## 2022-02-20 DIAGNOSIS — R300 Dysuria: Secondary | ICD-10-CM | POA: Diagnosis not present

## 2022-02-20 DIAGNOSIS — R3911 Hesitancy of micturition: Secondary | ICD-10-CM | POA: Diagnosis not present

## 2022-02-20 MED ORDER — VALACYCLOVIR HCL 1 G PO TABS
2000.0000 mg | ORAL_TABLET | ORAL | 5 refills | Status: AC
  Filled 2022-02-20: qty 30, 7d supply, fill #0
  Filled 2022-03-25: qty 30, 7d supply, fill #1
  Filled 2022-03-26: qty 30, 7d supply, fill #0
  Filled 2022-06-01: qty 30, 7d supply, fill #1

## 2022-03-06 ENCOUNTER — Ambulatory Visit (INDEPENDENT_AMBULATORY_CARE_PROVIDER_SITE_OTHER): Payer: 59 | Admitting: Urology

## 2022-03-06 ENCOUNTER — Other Ambulatory Visit (HOSPITAL_COMMUNITY): Payer: Self-pay

## 2022-03-06 ENCOUNTER — Other Ambulatory Visit (HOSPITAL_BASED_OUTPATIENT_CLINIC_OR_DEPARTMENT_OTHER): Payer: Self-pay

## 2022-03-06 ENCOUNTER — Encounter: Payer: Self-pay | Admitting: Urology

## 2022-03-06 VITALS — BP 116/73 | HR 72 | Ht 73.0 in | Wt 222.0 lb

## 2022-03-06 DIAGNOSIS — R339 Retention of urine, unspecified: Secondary | ICD-10-CM | POA: Diagnosis not present

## 2022-03-06 DIAGNOSIS — N411 Chronic prostatitis: Secondary | ICD-10-CM | POA: Diagnosis not present

## 2022-03-06 DIAGNOSIS — N529 Male erectile dysfunction, unspecified: Secondary | ICD-10-CM

## 2022-03-06 LAB — URINALYSIS
Bilirubin, UA: NEGATIVE
Blood, UA: NEGATIVE
Glucose, UA: NEGATIVE mg/dL
Ketones, POC UA: NEGATIVE mg/dL
Leukocytes, UA: NEGATIVE
Nitrite, UA: NEGATIVE
Protein Ur, POC: NEGATIVE mg/dL
Spec Grav, UA: 1.025 (ref 1.010–1.025)
Urobilinogen, UA: 0.2 E.U./dL
pH, UA: 7 (ref 5.0–8.0)

## 2022-03-06 LAB — BLADDER SCAN AMB NON-IMAGING

## 2022-03-06 MED ORDER — LEVOFLOXACIN 500 MG PO TABS
500.0000 mg | ORAL_TABLET | Freq: Every day | ORAL | 0 refills | Status: AC
  Filled 2022-03-06 (×2): qty 21, 21d supply, fill #0

## 2022-03-06 MED ORDER — ALFUZOSIN HCL ER 10 MG PO TB24
10.0000 mg | ORAL_TABLET | Freq: Every day | ORAL | 1 refills | Status: AC
  Filled 2022-03-06 (×2): qty 30, 30d supply, fill #0

## 2022-03-06 NOTE — Progress Notes (Signed)
Assessment: 1. Chronic prostatitis   2. Organic impotence     Plan: I personally reviewed the patient's chart including provider notes, lab results. Levaquin 500 mg daily x 21 days.  Rx sent. Alfuzosin 10 mg daily.  Rx sent. Return to office in 1 month  Chief Complaint:  Chief Complaint  Patient presents with   Dysuria   Urinary Frequency    History of Present Illness:  William Holt is a 37 y.o. male who is seen for evaluation of urinary frequency, dysuria, decreased stream, and sensation of incomplete emptying.  His symptoms have been present for approximately 3 months.  He feels the need to void every 30-45 minutes.  He has nocturia 2-3 times.  He does have occasional dysuria.  He voids with a decreased stream and does not feel like he empties his bladder completely.  No gross hematuria or flank pain.  He has noted some intermittent discomfort in the left scrotal area.  He also reports severe pain after ejaculation.  He reports difficulty achieving and maintaining his erections for the past several months as well. He was evaluated by his PCP earlier this month.  Urinalysis was unremarkable.  PSA was 0.3. IPSS = 24 today.  He previously underwent evaluation at [facility] in March 2016 for urinary frequency and scrotal pain.  He was diagnosed with a left varicocele.  He was given a trial of Myrbetriq 25 mg but did not see benefit from the medication.  He has a family history of prostate cancer with his father. Past Medical History:  Past Medical History:  Diagnosis Date   Dyspepsia    GERD (gastroesophageal reflux disease)    IBS (irritable bowel syndrome)     Past Surgical History:  Past Surgical History:  Procedure Laterality Date   2 other hand surgeries,2006, 2002     ARTERY REPAIR Right 01/26/2014   Procedure: right thumb exploratio and repair artery;  Surgeon: Daryll Brod, MD;  Location: [place];  Service: Orthopedics;  Laterality: Right;    HAND SURGERY     2002, 2006    Allergies:  No Known Allergies  Family History:  Family History  Problem Relation Age of Onset   Deep vein thrombosis Mother    Prostate cancer Father    Deep vein thrombosis Sister     Social History:  Social History   Tobacco Use   Smoking status: Never   Smokeless tobacco: Never  Substance Use Topics   Alcohol use: No    Comment: social   Drug use: No    Review of symptoms:  Constitutional:  Negative for unexplained weight loss, night sweats, fever, chills ENT:  Negative for nose bleeds, sinus pain, painful swallowing CV:  Negative for chest pain, shortness of breath, exercise intolerance, palpitations, loss of consciousness Resp:  Negative for cough, wheezing, shortness of breath GI:  Negative for nausea, vomiting, diarrhea, bloody stools GU:  Positives noted in HPI; otherwise negative for gross hematuria, urinary incontinence Neuro:  Negative for seizures, poor balance, limb weakness, slurred speech Psych:  Negative for lack of energy, depression, anxiety Endocrine:  Negative for polydipsia, polyuria, symptoms of hypoglycemia (dizziness, hunger, sweating) Hematologic:  Negative for anemia, purpura, petechia, prolonged or excessive bleeding, use of anticoagulants  Allergic:  Negative for difficulty breathing or choking as a result of exposure to anything; no shellfish allergy; no allergic response (rash/itch) to materials, foods  Physical exam: BP 116/73   Pulse 72   Ht 6'  1" (1.854 m)   Wt 222 lb (100.7 kg)   BMI 29.29 kg/m  GENERAL APPEARANCE:  Well appearing, well developed, well nourished, NAD HEENT: Atraumatic, Normocephalic, oropharynx clear. NECK: Supple without lymphadenopathy or thyromegaly. LUNGS: Clear to auscultation bilaterally. HEART: Regular Rate and Rhythm without murmurs, gallops, or rubs. ABDOMEN: Soft, non-tender, No Masses. EXTREMITIES: Moves all extremities well.  Without clubbing, cyanosis, or  edema. NEUROLOGIC:  Alert and oriented x 3, normal gait, CN II-XII grossly intact.  MENTAL STATUS:  Appropriate. BACK:  Non-tender to palpation.  No CVAT SKIN:  Warm, dry and intact.   GU: Penis:  circumcised Meatus: Normal Scrotum: normal, no masses Testis: normal without masses bilateral Epididymis: normal Prostate: 20 g, minimally tender, no nodules Rectum: Normal tone,  no masses or tenderness   Results: U/A dipstick: negative  PVR: 42 ml

## 2022-03-21 DIAGNOSIS — R238 Other skin changes: Secondary | ICD-10-CM | POA: Diagnosis not present

## 2022-03-26 ENCOUNTER — Other Ambulatory Visit (HOSPITAL_COMMUNITY): Payer: Self-pay

## 2022-03-26 ENCOUNTER — Other Ambulatory Visit: Payer: Self-pay

## 2022-04-04 ENCOUNTER — Other Ambulatory Visit (HOSPITAL_COMMUNITY): Payer: Self-pay

## 2022-04-04 ENCOUNTER — Other Ambulatory Visit: Payer: Self-pay

## 2022-04-04 MED ORDER — AMOXICILLIN-POT CLAVULANATE 875-125 MG PO TABS
1.0000 | ORAL_TABLET | Freq: Two times a day (BID) | ORAL | 0 refills | Status: AC
  Filled 2022-04-04 (×2): qty 20, 10d supply, fill #0

## 2022-04-05 ENCOUNTER — Other Ambulatory Visit (HOSPITAL_COMMUNITY): Payer: Self-pay

## 2022-04-06 ENCOUNTER — Encounter: Payer: Self-pay | Admitting: Urology

## 2022-04-06 ENCOUNTER — Ambulatory Visit: Payer: 59 | Admitting: Urology

## 2022-04-06 VITALS — BP 109/71 | HR 64 | Ht 73.0 in | Wt 225.0 lb

## 2022-04-06 DIAGNOSIS — N529 Male erectile dysfunction, unspecified: Secondary | ICD-10-CM | POA: Diagnosis not present

## 2022-04-06 DIAGNOSIS — N411 Chronic prostatitis: Secondary | ICD-10-CM | POA: Diagnosis not present

## 2022-04-06 LAB — URINALYSIS
Bilirubin, UA: NEGATIVE
Blood, UA: NEGATIVE
Glucose, UA: NEGATIVE mg/dL
Ketones, UA: NEGATIVE
Leukocytes, UA: NEGATIVE
Nitrite, UA: NEGATIVE
Protein, UA: NEGATIVE
Spec Grav, UA: >=1.03 — AB (ref 1.010–1.025)
Urobilinogen, UA: 0.2 E.U./dL
pH, UA: 6 (ref 5.0–8.0)

## 2022-04-06 NOTE — Progress Notes (Signed)
Assessment: 1. Chronic prostatitis     Plan: Bladder diet sheet given. Discussed discontinuation of alfuzosin and resuming if he feels like his urinary symptoms worsen. Return to office in 3 months or sooner if symptoms return.  Chief Complaint:  Chief Complaint  Patient presents with   Prostatitis   History of Present Illness:  William Holt is a 37 y.o. male who is seen for continued evaluation of chronic prostatitis.  He presented in January 2024 with symptoms of urinary frequency, dysuria, decreased stream, and sensation of incomplete emptying.  His symptoms had been present for approximately 3 months.  He reported urinary frequency, voiding every 30-45 minutes, nocturia 2-3 times, and occasional dysuria.  He was voiding with a decreased stream and did not feel like he emptied his bladder completely.  No gross hematuria or flank pain.  He noted some intermittent discomfort in the left scrotal area and severe pain after ejaculation.  He reported difficulty achieving and maintaining his erections for the past several months as well. He was evaluated by his PCP in January 2024.  Urinalysis was unremarkable.  PSA was 0.3. IPSS = 24.  PVR = 42 ml.  He previously underwent evaluation at [facility] in March 2016 for urinary frequency and scrotal pain.  He was diagnosed with a left varicocele.  He was given a trial of Myrbetriq 25 mg but did not see benefit from the medication.  He has a family history of prostate cancer with his father.  He is exam was consistent with prostatitis. He was treated with Levaquin x 21 days and started on alfuzosin 10 mg daily.  He returns today for follow-up.  He completed the Levaquin approximately 1 week ago.  He continues on alfuzosin.  He is currently on Augmentin for otitis.  He has noted improvement in his urinary symptoms.  He has decreased frequency and urgency.  He reports only occasional dysuria which is improved from his last visit.  No gross  hematuria.  The pain with ejaculation has improved. IPSS = 21 today.  Portions of the above documentation were copied from a prior visit for review purposes only.  Past Medical History:  Past Medical History:  Diagnosis Date   Dyspepsia    GERD (gastroesophageal reflux disease)    IBS (irritable bowel syndrome)     Past Surgical History:  Past Surgical History:  Procedure Laterality Date   2 other hand surgeries,2006, 2002     ARTERY REPAIR Right 01/26/2014   Procedure: right thumb exploratio and repair artery;  Surgeon: Daryll Brod, MD;  Location: [place];  Service: Orthopedics;  Laterality: Right;   HAND SURGERY     2002, 2006    Allergies:  No Known Allergies  Family History:  Family History  Problem Relation Age of Onset   Deep vein thrombosis Mother    Prostate cancer Father    Deep vein thrombosis Sister     Social History:  Social History   Tobacco Use   Smoking status: Never   Smokeless tobacco: Never  Substance Use Topics   Alcohol use: No    Comment: social   Drug use: No    ROS: Constitutional:  Negative for fever, chills, weight loss CV: Negative for chest pain, previous MI, hypertension Respiratory:  Negative for shortness of breath, wheezing, sleep apnea, frequent cough GI:  Negative for nausea, vomiting, bloody stool, GERD  Physical exam: BP 109/71   Pulse 64   Ht 6' 1"$  (1.854  m)   Wt 225 lb (102.1 kg)   BMI 29.69 kg/m  GENERAL APPEARANCE:  Well appearing, well developed, well nourished, NAD HEENT:  Atraumatic, normocephalic, oropharynx clear NECK:  Supple without lymphadenopathy or thyromegaly ABDOMEN:  Soft, non-tender, no masses EXTREMITIES:  Moves all extremities well, without clubbing, cyanosis, or edema NEUROLOGIC:  Alert and oriented x 3, normal gait, CN II-XII grossly intact MENTAL STATUS:  appropriate BACK:  Non-tender to palpation, No CVAT SKIN:  Warm, dry, and intact   Results: U/A dipstick: negative

## 2022-04-09 ENCOUNTER — Other Ambulatory Visit (HOSPITAL_COMMUNITY): Payer: Self-pay

## 2022-04-09 MED ORDER — PREDNISONE 10 MG (21) PO TBPK
ORAL_TABLET | ORAL | 0 refills | Status: AC
  Filled 2022-04-09: qty 21, 6d supply, fill #0

## 2022-04-09 MED ORDER — DOXYCYCLINE HYCLATE 100 MG PO CAPS
100.0000 mg | ORAL_CAPSULE | Freq: Two times a day (BID) | ORAL | 0 refills | Status: AC
  Filled 2022-04-09: qty 20, 10d supply, fill #0

## 2022-06-01 ENCOUNTER — Other Ambulatory Visit: Payer: Self-pay

## 2022-06-07 ENCOUNTER — Other Ambulatory Visit: Payer: Self-pay

## 2022-06-19 ENCOUNTER — Other Ambulatory Visit (HOSPITAL_COMMUNITY): Payer: Self-pay

## 2022-06-19 MED ORDER — SILDENAFIL CITRATE 100 MG PO TABS
100.0000 mg | ORAL_TABLET | Freq: Every day | ORAL | 5 refills | Status: AC | PRN
  Filled 2022-06-19: qty 6, 30d supply, fill #0

## 2022-07-05 ENCOUNTER — Ambulatory Visit: Payer: 59 | Admitting: Urology

## 2022-07-14 ENCOUNTER — Other Ambulatory Visit (HOSPITAL_COMMUNITY): Payer: Self-pay

## 2022-10-16 DIAGNOSIS — Z79890 Hormone replacement therapy (postmenopausal): Secondary | ICD-10-CM | POA: Diagnosis not present

## 2022-10-16 DIAGNOSIS — E559 Vitamin D deficiency, unspecified: Secondary | ICD-10-CM | POA: Diagnosis not present

## 2022-10-16 DIAGNOSIS — E569 Vitamin deficiency, unspecified: Secondary | ICD-10-CM | POA: Diagnosis not present

## 2022-10-16 DIAGNOSIS — R7301 Impaired fasting glucose: Secondary | ICD-10-CM | POA: Diagnosis not present

## 2022-10-16 DIAGNOSIS — E539 Vitamin B deficiency, unspecified: Secondary | ICD-10-CM | POA: Diagnosis not present

## 2022-10-16 DIAGNOSIS — Z13220 Encounter for screening for lipoid disorders: Secondary | ICD-10-CM | POA: Diagnosis not present

## 2022-10-16 DIAGNOSIS — Z79899 Other long term (current) drug therapy: Secondary | ICD-10-CM | POA: Diagnosis not present

## 2022-10-23 ENCOUNTER — Other Ambulatory Visit (HOSPITAL_COMMUNITY): Payer: Self-pay

## 2022-10-23 MED ORDER — CEPHALEXIN 500 MG PO CAPS
500.0000 mg | ORAL_CAPSULE | Freq: Three times a day (TID) | ORAL | 0 refills | Status: AC
  Filled 2022-10-23: qty 30, 10d supply, fill #0

## 2022-10-26 DIAGNOSIS — R790 Abnormal level of blood mineral: Secondary | ICD-10-CM | POA: Diagnosis not present

## 2022-10-26 DIAGNOSIS — R7401 Elevation of levels of liver transaminase levels: Secondary | ICD-10-CM | POA: Diagnosis not present

## 2022-10-26 DIAGNOSIS — M130 Polyarthritis, unspecified: Secondary | ICD-10-CM | POA: Diagnosis not present

## 2022-12-06 ENCOUNTER — Other Ambulatory Visit (HOSPITAL_COMMUNITY): Payer: Self-pay

## 2022-12-06 MED ORDER — VALACYCLOVIR HCL 1 G PO TABS
ORAL_TABLET | ORAL | 5 refills | Status: AC
  Filled 2022-12-06: qty 30, 15d supply, fill #0

## 2022-12-10 ENCOUNTER — Other Ambulatory Visit (HOSPITAL_COMMUNITY): Payer: Self-pay

## 2022-12-10 MED ORDER — SILDENAFIL CITRATE 100 MG PO TABS
100.0000 mg | ORAL_TABLET | Freq: Every day | ORAL | 5 refills | Status: AC | PRN
  Filled 2022-12-10: qty 30, 30d supply, fill #0

## 2022-12-17 ENCOUNTER — Inpatient Hospital Stay: Payer: 59

## 2022-12-17 ENCOUNTER — Inpatient Hospital Stay: Payer: 59 | Admitting: Oncology

## 2022-12-31 ENCOUNTER — Inpatient Hospital Stay: Payer: 59

## 2022-12-31 ENCOUNTER — Inpatient Hospital Stay: Payer: 59 | Admitting: Oncology

## 2023-01-14 ENCOUNTER — Inpatient Hospital Stay: Payer: 59

## 2023-01-14 ENCOUNTER — Inpatient Hospital Stay: Payer: 59 | Admitting: Oncology

## 2023-01-14 NOTE — Progress Notes (Deleted)
[facility]  HEMATOLOGY CLINIC CONSULTATION NOTE    PATIENT NAME: William Holt   MR#: [id number] DOB: 07-May-1985  DATE OF SERVICE: 01/14/2023   REFERRING PHYSICIAN  Etta Quill, PA-C  Patient Care Team: Mechele Claude as PCP - General (Family Medicine)   REASON FOR CONSULTATION/ CHIEF COMPLAINT:  Heterozygosity for H63D mutation.  HISTORY OF PRESENT ILLNESS:  William Holt is a 37 y.o. male, with a past medical history of generalized anxiety disorder, asthma, was referred to our service after he was found to have heterozygosity for H63D mutation.  Discussed the use of AI scribe software for clinical note transcription with the patient, who gave verbal consent to proceed.   On 10/26/2022, hereditary hemochromatosis testing at his PCPs office showed heterozygosity for H63D mutation.  There was no evidence of C282Y or S65C mutations.  Apparently ferritin and LFTs were elevated but we do not have these records available.  He denies fever, cough, diarrhea, or other infectious symptoms.  He denies epistaxis, bloody stool, melena, hematuria, bruising or other bleeding symptoms. He also denies unintentional weight loss, night sweats or other constitutional symptoms.  MEDICAL HISTORY Past Medical History:  Diagnosis Date   Dyspepsia    GERD (gastroesophageal reflux disease)    IBS (irritable bowel syndrome)      SURGICAL HISTORY Past Surgical History:  Procedure Laterality Date   2 other hand surgeries,2006, 2002     ARTERY REPAIR Right 01/26/2014   Procedure: right thumb exploratio and repair artery;  Surgeon: Cindee Salt, MD;  Location: [facility];  Service: Orthopedics;  Laterality: Right;   HAND SURGERY     2002, 2006     SOCIAL HISTORY: He reports that he has never smoked. He has never used smokeless tobacco. He reports that he does not drink alcohol and does not use drugs. Social History   Socioeconomic History   Marital  status: Married    Spouse name: Not on file   Number of children: Not on file   Years of education: Not on file   Highest education level: Not on file  Occupational History   Not on file  Tobacco Use   Smoking status: Never   Smokeless tobacco: Never  Substance and Sexual Activity   Alcohol use: No    Comment: social   Drug use: No   Sexual activity: Not on file  Other Topics Concern   Not on file  Social History Narrative   ** Merged History Encounter **       Social Determinants of Health   Financial Resource Strain: Not on file  Food Insecurity: Not on file  Transportation Needs: Not on file  Physical Activity: Not on file  Stress: Not on file  Social Connections: Not on file  Intimate Partner Violence: Not on file    FAMILY HISTORY: His family history includes Deep vein thrombosis in his mother and sister; Prostate cancer in his father.  CURRENT MEDICATIONS   Current Outpatient Medications  Medication Instructions   alfuzosin (UROXATRAL) 10 mg, Oral, Daily   amoxicillin-clavulanate (AUGMENTIN) 875-125 MG tablet Take 1 tablet by mouth every 12 hours for 10 days   cephALEXin (KEFLEX) 500 MG capsule Take 1 capsule (500 mg total) by mouth 3 (three) times daily for 10 days   doxycycline (VIBRAMYCIN) 100 MG capsule Take 1 capsule (100 mg total) by mouth every 12 (twelve) hours for 10 days   FAMOTIDINE PO Oral  ibuprofen (ADVIL) 800 mg, Oral, 2 times daily PRN   predniSONE (STERAPRED UNI-PAK 21 TAB) 10 MG (21) TBPK tablet Take as directed on package   sertraline (ZOLOFT) 50 MG tablet TAKE 1 TABLET BY MOUTH DAILY   sildenafil (VIAGRA) 100 MG tablet Take 1 tablet by mouth daily as needed   sildenafil (VIAGRA) 100 MG tablet Take 1 tablet by mouth daily as needed   valACYclovir (VALTREX) 1000 MG tablet Take 2 tablets (2,000 mg total) by mouth at the onset of a cold sore, and 2 tablets again in 12 hours (4 tablets per episode/cold sore)   valACYclovir (VALTREX) 1000 MG  tablet Take 2 tablets at the onset of a cold sore, and 2 tablets again in 12 hours (4 tablets per episode/cold sore)     ALLERGIES  He has No Known Allergies.  REVIEW OF SYSTEMS:  Review of Systems - Oncology   Rest of the pertinent review of systems is unremarkable except as mentioned above in HPI.  PHYSICAL EXAMINATION:  ECOG PERFORMANCE STATUS: {CHL ONC ECOG [phone]}  There were no vitals filed for this visit. There were no vitals filed for this visit.  Physical Exam  ***  LABORATORY DATA:   I have reviewed the data as listed.  No results found for any visits on 01/14/23.  Lab Results  Component Value Date   WBC 8.7 02/08/2016   NEUTROABS 3.8 02/07/2016   HGB 13.2 02/08/2016   HCT 37.8 (L) 02/08/2016   MCV 83.1 02/08/2016   PLT 118 (L) 02/08/2016    No results found for: "IRON", "TIBC", "FERRITIN"   No results for input(s): "NA", "K", "CL", "CO2", "GLUCOSE", "BUN", "CREATININE", "CALCIUM", "GFRNONAA", "GFRAA", "PROT", "ALBUMIN", "AST", "ALT", "ALKPHOS", "BILITOT", "BILIDIR", "IBILI" in the last 8760 hours.  No results found for this or any previous visit (from the past 72 hour(s)).  RADIOGRAPHIC STUDIES:  I have personally reviewed the radiological images as listed and agreed with the findings in the report.  No results found.  *** No pertinent imaging studies available to review.  ASSESSMENT & PLAN:  No problem-specific Assessment & Plan notes found for this encounter.    I have counseled patient on smoking cessation, and offered nicotine patch, patient states that heis going to try to quit.   No orders of the defined types were placed in this encounter.   The total time spent in the appointment was {CHL ONC TIME VISIT - WUJWJ:[id number]} encounter with the patient, including review of chart and results of various tests, discussion about plan of care and coordination of care plan.  I reviewed lab results and outside records for this visit  and discussed relevant results with the patient. Diagnosis, plan of care and treatment options were also discussed in detail with the patient. Opportunity provided to ask questions and answers provided to his apparent satisfaction. Provided instructions to call our clinic with any problems, questions or concerns prior to return visit. I recommended to continue follow-up with PCP and sub-specialists. He verbalized understanding and agreed with the plan. No barriers to learning was detected.   Future Appointments  Date Time Provider Department Center  01/14/2023  1:00 PM Meryl Crutch, MD [facility] None  01/14/2023  2:00 PM [facility] PHLEBOTOMIST [facility] None     Meryl Crutch, MD  01/14/2023 9:12 AM  [facility] [address] [place] [address] Dept: [phone] Dept Fax: [phone]  This document was completed utilizing speech recognition software. Grammatical errors, random word insertions, pronoun errors, and incomplete sentences are an occasional consequence of this system due to software limitations, ambient noise, and hardware issues. Any formal questions or concerns about the content, text or information contained within the body of this dictation should be directly addressed to the provider for clarification.

## 2023-03-05 ENCOUNTER — Inpatient Hospital Stay: Payer: Self-pay | Admitting: Oncology

## 2023-03-05 ENCOUNTER — Inpatient Hospital Stay: Payer: Self-pay

## 2023-04-30 DIAGNOSIS — M25521 Pain in right elbow: Secondary | ICD-10-CM | POA: Diagnosis not present

## 2023-05-01 ENCOUNTER — Other Ambulatory Visit (HOSPITAL_COMMUNITY): Payer: Self-pay

## 2023-05-03 ENCOUNTER — Other Ambulatory Visit (HOSPITAL_COMMUNITY): Payer: Self-pay

## 2023-05-09 ENCOUNTER — Other Ambulatory Visit (HOSPITAL_COMMUNITY): Payer: Self-pay

## 2023-05-09 MED ORDER — MELOXICAM 15 MG PO TABS
15.0000 mg | ORAL_TABLET | Freq: Every day | ORAL | 0 refills | Status: AC
  Filled 2023-05-09: qty 30, 30d supply, fill #0

## 2023-05-21 ENCOUNTER — Other Ambulatory Visit (HOSPITAL_COMMUNITY): Payer: Self-pay

## 2023-05-21 MED ORDER — PREDNISONE 10 MG (21) PO TBPK
ORAL_TABLET | ORAL | 0 refills | Status: AC
  Filled 2023-05-21: qty 21, 6d supply, fill #0

## 2023-10-07 DIAGNOSIS — Z13220 Encounter for screening for lipoid disorders: Secondary | ICD-10-CM | POA: Diagnosis not present

## 2023-10-07 DIAGNOSIS — Z125 Encounter for screening for malignant neoplasm of prostate: Secondary | ICD-10-CM | POA: Diagnosis not present

## 2023-10-07 DIAGNOSIS — Z0000 Encounter for general adult medical examination without abnormal findings: Secondary | ICD-10-CM | POA: Diagnosis not present

## 2023-10-07 DIAGNOSIS — E119 Type 2 diabetes mellitus without complications: Secondary | ICD-10-CM | POA: Diagnosis not present

## 2023-10-07 DIAGNOSIS — Z131 Encounter for screening for diabetes mellitus: Secondary | ICD-10-CM | POA: Diagnosis not present

## 2023-10-17 ENCOUNTER — Ambulatory Visit (INDEPENDENT_AMBULATORY_CARE_PROVIDER_SITE_OTHER): Payer: Self-pay | Admitting: Family

## 2023-10-17 VITALS — BP 126/64 | HR 62 | Temp 98.1°F | Ht 71.0 in | Wt 219.2 lb

## 2023-10-17 DIAGNOSIS — Z0000 Encounter for general adult medical examination without abnormal findings: Secondary | ICD-10-CM | POA: Diagnosis not present

## 2023-10-17 LAB — CBC WITH DIFFERENTIAL/PLATELET
Basophils Absolute: 0.1 10*3/uL (ref 0.0–0.1)
Basophils Relative: 0.9 % (ref 0.0–3.0)
Eosinophils Absolute: 0.3 10*3/uL (ref 0.0–0.7)
Eosinophils Relative: 4.1 % (ref 0.0–5.0)
HCT: 44 % (ref 39.0–52.0)
Hemoglobin: 15 g/dL (ref 13.0–17.0)
Lymphocytes Relative: 29.6 % (ref 12.0–46.0)
Lymphs Abs: 2.1 10*3/uL (ref 0.7–4.0)
MCHC: 34.1 g/dL (ref 30.0–36.0)
MCV: 83.9 fl (ref 78.0–100.0)
Monocytes Absolute: 0.6 10*3/uL (ref 0.1–1.0)
Monocytes Relative: 7.9 % (ref 3.0–12.0)
Neutro Abs: 4 10*3/uL (ref 1.4–7.7)
Neutrophils Relative %: 57.5 % (ref 43.0–77.0)
Platelets: 226 10*3/uL (ref 150.0–400.0)
RBC: 5.25 Mil/uL (ref 4.22–5.81)
RDW: 12.6 % (ref 11.5–15.5)
WBC: 7 10*3/uL (ref 4.0–10.5)

## 2023-10-17 LAB — TSH: TSH: 0.64 u[IU]/mL (ref 0.35–5.50)

## 2023-10-17 LAB — COMPREHENSIVE METABOLIC PANEL WITH GFR
ALT: 17 U/L (ref 0–53)
AST: 21 U/L (ref 0–37)
Albumin: 4.8 g/dL (ref 3.5–5.2)
Alkaline Phosphatase: 87 U/L (ref 39–117)
BUN: 14 mg/dL (ref 6–23)
CO2: 29 meq/L (ref 19–32)
Calcium: 9.2 mg/dL (ref 8.4–10.5)
Chloride: 100 meq/L (ref 96–112)
Creatinine, Ser: 1.06 mg/dL (ref 0.40–1.50)
GFR: 88.68 mL/min (ref >60.00)
Glucose, Bld: 87 mg/dL (ref 70–99)
Potassium: 3.9 meq/L (ref 3.5–5.1)
Sodium: 138 meq/L (ref 135–145)
Total Bilirubin: 0.9 mg/dL (ref 0.2–1.2)
Total Protein: 7.6 g/dL (ref 6.0–8.3)

## 2023-10-17 LAB — LIPID PANEL
Cholesterol: 206 mg/dL — ABNORMAL HIGH (ref 0–200)
HDL: 46.1 mg/dL (ref >39.00)
LDL Cholesterol: 139 mg/dL — ABNORMAL HIGH (ref 0–99)
NonHDL: 159.68
Total CHOL/HDL Ratio: 4
Triglycerides: 104 mg/dL (ref 0.0–149.0)
VLDL: 20.8 mg/dL (ref 0.0–40.0)

## 2023-10-17 NOTE — Progress Notes (Signed)
 Complete physical exam  Patient: William Holt   DOB: 1986-02-01   38 y.o. Male  MRN: [id number]  Subjective:    No chief complaint on file.   William Holt is a 38 y.o. male who presents today for a complete physical exam. He reports consuming a general diet. The patient does not participate in regular exercise at present. He has a physically demanding job in [employer]. He generally feels well. He reports sleeping well. He does not have additional problems to discuss today.    Most recent fall risk assessment:    10/17/2023    1:16 PM  Fall Risk   Falls in the past year? 0  Number falls in past yr: 0  Injury with Fall? 0  Risk for fall due to : No Fall Risks  Follow up Falls evaluation completed     Most recent depression screenings:     No data to display              Patient Care Team: Dorise Toribio PARAS, PA-C as PCP - General (Family Medicine)   Outpatient Medications Prior to Visit  Medication Sig   alfuzosin  (UROXATRAL ) 10 MG 24 hr tablet Take 1 tablet (10 mg total) by mouth daily.   amoxicillin -clavulanate (AUGMENTIN ) 875-125 MG tablet Take 1 tablet by mouth every 12 hours for 10 days   cephALEXin  (KEFLEX ) 500 MG capsule Take 1 capsule (500 mg total) by mouth 3 (three) times daily for 10 days   doxycycline  (VIBRAMYCIN ) 100 MG capsule Take 1 capsule (100 mg total) by mouth every 12 (twelve) hours for 10 days   FAMOTIDINE  PO Take by mouth.   ibuprofen  (ADVIL ) 800 MG tablet Take 1 tablet (800 mg total) by mouth 2 (two) times daily as needed.   meloxicam  (MOBIC ) 15 MG tablet Take 1 tablet (15 mg total) by mouth daily.   predniSONE  (STERAPRED UNI-PAK 21 TAB) 10 MG (21) TBPK tablet Take as directed on package   predniSONE  (STERAPRED UNI-PAK 21 TAB) 10 MG (21) TBPK tablet Take as directed on package   sildenafil  (VIAGRA ) 100 MG tablet Take 1 tablet by mouth daily as needed   sildenafil  (VIAGRA ) 100 MG tablet Take 1 tablet by mouth daily as needed   valACYclovir   (VALTREX ) 1000 MG tablet Take 2 tablets (2,000 mg total) by mouth at the onset of a cold sore, and 2 tablets again in 12 hours (4 tablets per episode/cold sore)   valACYclovir  (VALTREX ) 1000 MG tablet Take 2 tablets at the onset of a cold sore, and 2 tablets again in 12 hours (4 tablets per episode/cold sore)   sertraline  (ZOLOFT ) 50 MG tablet TAKE 1 TABLET BY MOUTH DAILY   No facility-administered medications prior to visit.    Review of Systems  All other systems reviewed and are negative.         Objective:     BP 126/64   Pulse 62   Temp 98.1 F (36.7 C) (Temporal)   Ht 5' 11 (1.803 m)   Wt 219 lb 4 oz (99.5 kg)   SpO2 96%   BMI 30.58 kg/m    Physical Exam Vitals and nursing note reviewed.  Constitutional:      Appearance: Normal appearance. He is normal weight.  HENT:     Head: Normocephalic and atraumatic.     Right Ear: Tympanic membrane, ear canal and external ear normal.     Left Ear: Tympanic membrane, ear canal and external ear normal.  Nose: Nose normal.     Mouth/Throat:     Mouth: Mucous membranes are moist.     Pharynx: Oropharynx is clear.  Eyes:     Extraocular Movements: Extraocular movements intact.     Conjunctiva/sclera: Conjunctivae normal.     Pupils: Pupils are equal, round, and reactive to light.  Cardiovascular:     Rate and Rhythm: Normal rate and regular rhythm.     Pulses: Normal pulses.     Heart sounds: Normal heart sounds.  Pulmonary:     Effort: Pulmonary effort is normal.     Breath sounds: Normal breath sounds.  Abdominal:     General: Abdomen is flat. Bowel sounds are normal.     Palpations: Abdomen is soft.  Musculoskeletal:        General: Normal range of motion.     Cervical back: Normal range of motion and neck supple.  Skin:    General: Skin is warm and dry.  Neurological:     General: No focal deficit present.     Mental Status: He is alert and oriented to person, place, and time. Mental status is at baseline.   Psychiatric:        Mood and Affect: Mood normal.        Behavior: Behavior normal.        Thought Content: Thought content normal.      No results found for any visits on 10/17/23.     Assessment & Plan:    Routine Health Maintenance and Physical Exam  Immunization History  Administered Date(s) Administered   Influenza,inj,Quad PF,6+ Mos 02/10/2016    Health Maintenance  Topic Date Due   Hepatitis C Screening  Never done   DTaP/Tdap/Td (1 - Tdap) Never done   Hepatitis B Vaccines 19-59 Average Risk (1 of 3 - 19+ 3-dose series) Never done   HPV VACCINES (1 - 3-dose SCDM series) Never done   COVID-19 Vaccine (1 - 2024-25 season) Never done   INFLUENZA VACCINE  09/20/2023   HIV Screening  Completed   Pneumococcal Vaccine  Aged Out   Meningococcal B Vaccine  Aged Out    Discussed health benefits of physical activity, and encouraged him to engage in regular exercise appropriate for his age and condition.  Problem List Items Addressed This Visit   None Visit Diagnoses       Preventative health care    -  Primary   Relevant Orders   CBC w/Diff   CMP   Lipid panel   TSH      Encouraged healthy diet and exercise.  Call the office with any questions or concerns.  Recheck in 1 year and sooner as needed.    Alondra Sahni B Tikia Skilton, FNP

## 2023-10-18 ENCOUNTER — Ambulatory Visit: Payer: Self-pay | Admitting: Family

## 2023-10-25 ENCOUNTER — Other Ambulatory Visit (HOSPITAL_COMMUNITY): Payer: Self-pay

## 2023-10-25 MED ORDER — TRINTELLIX 5 MG PO TABS
5.0000 mg | ORAL_TABLET | Freq: Every day | ORAL | 0 refills | Status: AC
  Filled 2023-10-25: qty 90, 90d supply, fill #0

## 2023-11-04 ENCOUNTER — Other Ambulatory Visit (HOSPITAL_BASED_OUTPATIENT_CLINIC_OR_DEPARTMENT_OTHER): Payer: Self-pay

## 2023-11-04 MED ORDER — PREDNISONE 10 MG (21) PO TBPK
ORAL_TABLET | ORAL | 0 refills | Status: AC
  Filled 2023-11-04: qty 21, 6d supply, fill #0

## 2023-12-10 DIAGNOSIS — M25521 Pain in right elbow: Secondary | ICD-10-CM | POA: Diagnosis not present

## 2023-12-10 DIAGNOSIS — M25511 Pain in right shoulder: Secondary | ICD-10-CM | POA: Diagnosis not present
